# Patient Record
Sex: Female | Born: 1980 | Race: Black or African American | Hispanic: No | Marital: Married | Smoking: Never smoker
Health system: Southern US, Community
[De-identification: ages and names within clinical notes are randomized; demographics above are authoritative.]

## PROBLEM LIST (undated history)

## (undated) ENCOUNTER — Inpatient Hospital Stay (HOSPITAL_COMMUNITY): Payer: Self-pay

## (undated) DIAGNOSIS — Z789 Other specified health status: Secondary | ICD-10-CM

## (undated) DIAGNOSIS — IMO0002 Reserved for concepts with insufficient information to code with codable children: Secondary | ICD-10-CM

## (undated) DIAGNOSIS — N200 Calculus of kidney: Secondary | ICD-10-CM

## (undated) DIAGNOSIS — D649 Anemia, unspecified: Secondary | ICD-10-CM

## (undated) DIAGNOSIS — J302 Other seasonal allergic rhinitis: Secondary | ICD-10-CM

## (undated) HISTORY — DX: Reserved for concepts with insufficient information to code with codable children: IMO0002

## (undated) HISTORY — DX: Other seasonal allergic rhinitis: J30.2

## (undated) HISTORY — PX: RIGHT OOPHORECTOMY: SHX2359

---

## 2006-04-24 HISTORY — PX: OTHER SURGICAL HISTORY: SHX169

## 2009-04-13 ENCOUNTER — Other Ambulatory Visit: Admission: RE | Admit: 2009-04-13 | Discharge: 2009-04-13 | Payer: Self-pay | Admitting: Family Medicine

## 2010-01-31 ENCOUNTER — Ambulatory Visit: Payer: Self-pay | Admitting: Family Medicine

## 2010-01-31 DIAGNOSIS — S335XXA Sprain of ligaments of lumbar spine, initial encounter: Secondary | ICD-10-CM

## 2010-02-01 ENCOUNTER — Encounter (INDEPENDENT_AMBULATORY_CARE_PROVIDER_SITE_OTHER): Payer: Self-pay | Admitting: *Deleted

## 2010-02-01 DIAGNOSIS — IMO0002 Reserved for concepts with insufficient information to code with codable children: Secondary | ICD-10-CM | POA: Insufficient documentation

## 2010-02-03 ENCOUNTER — Ambulatory Visit (HOSPITAL_COMMUNITY)
Admission: RE | Admit: 2010-02-03 | Discharge: 2010-02-03 | Payer: Self-pay | Source: Home / Self Care | Admitting: Family Medicine

## 2010-02-04 ENCOUNTER — Encounter: Payer: Self-pay | Admitting: Family Medicine

## 2010-02-04 ENCOUNTER — Encounter: Payer: Self-pay | Admitting: *Deleted

## 2010-02-08 ENCOUNTER — Encounter: Payer: Self-pay | Admitting: Family Medicine

## 2010-04-20 ENCOUNTER — Ambulatory Visit (HOSPITAL_COMMUNITY)
Admission: RE | Admit: 2010-04-20 | Discharge: 2010-04-20 | Payer: Self-pay | Source: Home / Self Care | Attending: Obstetrics and Gynecology | Admitting: Obstetrics and Gynecology
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
## 2010-05-24 NOTE — Miscellaneous (Signed)
Summary: MRI ORDER  Clinical Lists Changes  Problems: Added new problem of THORACIC/LUMBOSACRAL NEURITIS/RADICULITIS UNSPEC (ICD-724.4) Orders: Added new Test order of MRI without Contrast (MRI w/o Contrast) - Signed      Impression & Recommendations:  Problem # 1:  THORACIC/LUMBOSACRAL NEURITIS/RADICULITIS UNSPEC (ICD-724.4)  Her updated medication list for this problem includes:    Flexeril 5 Mg Tabs (Cyclobenzaprine hcl) .Marland Kitchen... 1 by mouth two times a day / three times a day as needed back spasm  Orders: MRI without Contrast (MRI w/o Contrast) concern for acute disc rupture as she has had NO improvement in symptoms with conservative treatment, having difficulty ambulating or sitting.  Complete Medication List: 1)  Flexeril 5 Mg Tabs (Cyclobenzaprine hcl) .Marland Kitchen.. 1 by mouth two times a day / three times a day as needed back spasm   MRI SCHD FOR THUR OCT 13TH AT 5PM. PT TEXTED THE APPT TIME AND DATE. Lillia Pauls CMA  February 01, 2010 4:25 PM

## 2010-05-24 NOTE — Miscellaneous (Signed)
Summary: Toradol/TS  Clinical Lists Changes  Orders: Added new Service order of Ketorolac-Toradol 15mg  (Z6109) - Signed      Medication Administration  Injection # 1:    Medication: Ketorolac-Toradol 15mg     Diagnosis: LUMBAR SPRAIN AND STRAIN (ICD-847.2)    Route: IM    Site: LUOQ gluteus    Exp Date: 02/23/2011    Lot #: [id number]    Comments: 60 mg IM given per DR. Jennette Kettle    Patient tolerated injection without complications    Given by: Arlyss Repress CMA, (February 04, 2010 12:27 PM)  Orders Added: 1)  Ketorolac-Toradol 15mg  [[id number]]

## 2010-05-24 NOTE — Miscellaneous (Signed)
  Clinical Lists Changes  Medications: Added new medication of PREDNISONE 10 MG TABS (PREDNISONE) sig 6 by mouth once daily 5 days, 4 by mouth once daily 5 days and 2 by mouth once daily 2 days - Signed Rx of PREDNISONE 10 MG TABS (PREDNISONE) sig 6 by mouth once daily 5 days, 4 by mouth once daily 5 days and 2 by mouth once daily 2 days;  #54 x 0;  Signed;  Entered by: Denny Levy MD;  Authorized by: Denny Levy MD;  Method used: Electronically to [facility] [address] Shipping/mailing, [place], [place]  [address], Ph: [phone], Fax: [phone]    Prescriptions: PREDNISONE 10 MG TABS (PREDNISONE) sig 6 by mouth once daily 5 days, 4 by mouth once daily 5 days and 2 by mouth once daily 2 days  #54 x 0   Entered and Authorized by:   Denny Levy MD   Signed by:   Denny Levy MD on 02/04/2010   Method used:   Electronically to        [facility]* (retail)       [address]       [address] Shipping[address]mailing       [place], [place]  [address]       Ph: [phone]       Fax: [phone]   RxID:   [id number]

## 2010-05-24 NOTE — Miscellaneous (Signed)
  Clinical Lists Changes Spoke w TC--80% better. Given the quick improvement with steroids I suspect this is not a totally new herniation of the disc, probably an extrusion of an old herniation. I suspect at some point in a few years she may need surgical consult but for now we will cancel that consult.

## 2010-05-24 NOTE — Miscellaneous (Signed)
  Clinical Lists Changes  Orders: Added new Referral order of Neurosurgeon Referral (Neurosurgeon) - Signed      Complete Medication List: 1)  Flexeril 5 Mg Tabs (Cyclobenzaprine hcl) .Marland Kitchen.. 1 by mouth two times a day / three times a day as needed back spasm 2)  Prednisone 10 Mg Tabs (Prednisone) .... Sig 6 by mouth once daily 5 days, 4 by mouth once daily 5 days and 2 by mouth once daily 2 days

## 2010-05-24 NOTE — Miscellaneous (Signed)
  Clinical Lists Changes discussed MRI fidings. Pain still 7/10 despite NSAIDS and flexeril--is still trying to work (full clinic today) but pain is so intense she cannot sit or stand well.Advised her cancel rest of clinic, start steroid dose pak. I think she should go ahead and see NSU as I suspect this will need surgery. Will set up NSU appt eval at [facility]. She sill touch base with me Monday 10/16.

## 2010-07-02 ENCOUNTER — Emergency Department (HOSPITAL_COMMUNITY)
Admission: EM | Admit: 2010-07-02 | Discharge: 2010-07-02 | Disposition: A | Discharge status: Home / Self Care | Payer: 59 | Attending: Emergency Medicine | Admitting: Emergency Medicine

## 2010-07-02 DIAGNOSIS — L02419 Cutaneous abscess of limb, unspecified: Secondary | ICD-10-CM | POA: Insufficient documentation

## 2010-10-28 ENCOUNTER — Other Ambulatory Visit (HOSPITAL_COMMUNITY): Payer: Self-pay | Admitting: Obstetrics and Gynecology

## 2010-10-28 ENCOUNTER — Encounter (HOSPITAL_COMMUNITY): Payer: Self-pay

## 2010-10-28 ENCOUNTER — Ambulatory Visit (HOSPITAL_COMMUNITY)
Admission: RE | Admit: 2010-10-28 | Discharge: 2010-10-28 | Disposition: A | Discharge status: Home / Self Care | Payer: 59 | Source: Ambulatory Visit | Attending: Obstetrics and Gynecology | Admitting: Obstetrics and Gynecology

## 2010-10-28 DIAGNOSIS — R05 Cough: Secondary | ICD-10-CM | POA: Insufficient documentation

## 2010-10-28 DIAGNOSIS — O99891 Other specified diseases and conditions complicating pregnancy: Secondary | ICD-10-CM | POA: Insufficient documentation

## 2010-10-28 DIAGNOSIS — R059 Cough, unspecified: Secondary | ICD-10-CM | POA: Insufficient documentation

## 2010-12-07 ENCOUNTER — Ambulatory Visit (HOSPITAL_COMMUNITY)
Admission: RE | Admit: 2010-12-07 | Discharge: 2010-12-07 | Disposition: A | Discharge status: Home / Self Care | Payer: 59 | Source: Ambulatory Visit | Attending: Obstetrics and Gynecology | Admitting: Obstetrics and Gynecology

## 2010-12-07 ENCOUNTER — Other Ambulatory Visit (HOSPITAL_COMMUNITY): Payer: Self-pay | Admitting: Obstetrics and Gynecology

## 2010-12-07 DIAGNOSIS — R05 Cough: Secondary | ICD-10-CM

## 2010-12-07 DIAGNOSIS — R059 Cough, unspecified: Secondary | ICD-10-CM | POA: Insufficient documentation

## 2010-12-07 DIAGNOSIS — O99891 Other specified diseases and conditions complicating pregnancy: Secondary | ICD-10-CM | POA: Insufficient documentation

## 2010-12-07 DIAGNOSIS — R0989 Other specified symptoms and signs involving the circulatory and respiratory systems: Secondary | ICD-10-CM | POA: Insufficient documentation

## 2010-12-20 ENCOUNTER — Encounter: Payer: Self-pay | Admitting: Emergency Medicine

## 2010-12-20 ENCOUNTER — Ambulatory Visit (INDEPENDENT_AMBULATORY_CARE_PROVIDER_SITE_OTHER): Payer: 59 | Admitting: Emergency Medicine

## 2010-12-20 VITALS — BP 120/60 | HR 105 | Temp 98.8°F | Ht 63.0 in | Wt 191.0 lb

## 2010-12-20 DIAGNOSIS — R05 Cough: Secondary | ICD-10-CM

## 2010-12-20 DIAGNOSIS — R053 Chronic cough: Secondary | ICD-10-CM | POA: Insufficient documentation

## 2010-12-20 NOTE — Assessment & Plan Note (Signed)
Suspect components of GERD (exacerbated by pregnancy) and allergies. Consider pregnancy induced asthma.  - rx GERD and allergies at same time - f/u if still coughing, get spiro at that time/.

## 2010-12-20 NOTE — Patient Instructions (Signed)
Please start using fluticasone nasal spray, 2 sprays twice a day Restart zyrtec Continue prevacid 30mg  daily Call to follow up with Dr Delton Coombes if you are still coughing in 2 weeks. We will consider spirometry at that time if needed.

## 2010-12-20 NOTE — Progress Notes (Signed)
  Subjective:    Patient ID: Elaine Morris, female    DOB: 1980-05-02, 30 y.o.   MRN: [id number]  HPI 30 yo never smoker, hx allergies, G1P0 now 29 weeks. Evaluated for cough. She had some GERD since the pregnancy and has been on prevacid for a month. The GERD is better but not her cough. She was also started on flonase and it helped some but cough didn't disappear. Hasn't taken zyrtec x 1 month. She has been coughing x 4 months. Initially dry but then she began to have sputum in the am and at night, esp when supine.   Review of Systems  HENT: Positive for congestion and sneezing.   Respiratory: Positive for cough and shortness of breath.   Gastrointestinal: Positive for abdominal pain.    Past Medical History  Diagnosis Date  . Seasonal allergies   . Herniated disc L5     Family History  Problem Relation Age of Onset  . Asthma Mother   . Deep vein thrombosis Mother     dvt  . Melanoma      mgf     History   Social History  . Marital Status: Married    Spouse Name: nathan    Number of Children: 0  . Years of Education: N/A   Occupational History  . Physician    Social History Main Topics  . Smoking status: Never Smoker   . Smokeless tobacco: Not on file  . Alcohol Use: No  . Drug Use: No  . Sexually Active: Not on file   Other Topics Concern  . Not on file   Social History Narrative  . No narrative on file     No Known Allergies   No outpatient prescriptions prior to visit.         Objective:   Physical Exam  Gen: Pleasant, well-nourished, in no distress,  normal affect  ENT: No lesions,  mouth clear,  oropharynx clear, no postnasal drip  Neck: No JVD, no TMG, no carotid bruits  Lungs: No use of accessory muscles, no dullness to percussion, clear without rales or rhonchi  Cardiovascular: RRR, heart sounds normal, no murmur or gallops, no peripheral edema  Abdomen: gravid uterus  Musculoskeletal: No deformities, no cyanosis or  clubbing  Neuro: alert, non focal  Skin: Warm, no lesions or rashes     Assessment & Plan:  Chronic cough Suspect components of GERD (exacerbated by pregnancy) and allergies. Consider pregnancy induced asthma.  - rx GERD and allergies at same time - f/u if still coughing, get spiro at that time/.

## 2011-01-02 ENCOUNTER — Encounter (HOSPITAL_COMMUNITY): Payer: Self-pay | Admitting: *Deleted

## 2011-01-02 ENCOUNTER — Inpatient Hospital Stay (HOSPITAL_COMMUNITY)
Admission: AD | Admit: 2011-01-02 | Discharge: 2011-01-03 | Disposition: A | Discharge status: Home / Self Care | Payer: 59 | Source: Ambulatory Visit | Attending: Obstetrics and Gynecology | Admitting: Obstetrics and Gynecology

## 2011-01-02 DIAGNOSIS — O4700 False labor before 37 completed weeks of gestation, unspecified trimester: Secondary | ICD-10-CM | POA: Insufficient documentation

## 2011-01-02 DIAGNOSIS — B9689 Other specified bacterial agents as the cause of diseases classified elsewhere: Secondary | ICD-10-CM | POA: Insufficient documentation

## 2011-01-02 DIAGNOSIS — O2390 Unspecified genitourinary tract infection in pregnancy, unspecified trimester: Secondary | ICD-10-CM | POA: Insufficient documentation

## 2011-01-02 DIAGNOSIS — N760 Acute vaginitis: Secondary | ICD-10-CM | POA: Insufficient documentation

## 2011-01-02 DIAGNOSIS — R109 Unspecified abdominal pain: Secondary | ICD-10-CM

## 2011-01-02 DIAGNOSIS — A499 Bacterial infection, unspecified: Secondary | ICD-10-CM | POA: Insufficient documentation

## 2011-01-02 DIAGNOSIS — O479 False labor, unspecified: Secondary | ICD-10-CM

## 2011-01-02 NOTE — Progress Notes (Signed)
Severe rt flank pain off/on x 2 weeks, contractions. Denies dysuria.

## 2011-01-02 NOTE — Progress Notes (Signed)
Pt c/o of rt flank pain present x 2-3 weeks-has gotten worse this weekend

## 2011-01-02 NOTE — ED Provider Notes (Signed)
Elaine Morris is a 30 y.o. female presenting for worsening of R flank pain that started a few weeks ago and worsening of UC's. Flank pain similar to Kidney stones in past. No relief w/ Tylenol 1 gm and 1800. Maternal Medical History:  Reason for admission: Reason for admission: contractions.  Contractions: Onset was 1 week ago.   Frequency: irregular.   Duration is approximately 60 seconds.   Perceived severity is moderate.    Fetal activity: Perceived fetal activity is normal.   Last perceived fetal movement was within the past hour.    Prenatal Complications - Diabetes: none.    OB History    Grav Para Term Preterm Abortions TAB SAB Ect Mult Living   1              Past Medical History  Diagnosis Date  . Seasonal allergies   . Herniated disc L5  Kidney stones  Past Surgical History  Procedure Date  . Partial right oopharectomy 2008   Family History: family history includes Asthma in her mother; Deep vein thrombosis in her mother; and Melanoma in an unspecified family member. Social History:  reports that she has never smoked. She does not have any smokeless tobacco history on file. She reports that she does not drink alcohol or use illicit drugs.  Review of Systems  Constitutional: Negative.   Eyes: Negative for blurred vision.  Respiratory: Positive for cough (x 4 months). Negative for sputum production, shortness of breath and wheezing.   Gastrointestinal: Negative.   Genitourinary: Positive for flank pain. Negative for dysuria, urgency, frequency and hematuria.  Musculoskeletal: Negative for back pain (mild LBP C/W previous MS pain).    Dilation: Closed Effacement (%): Thick Exam by:: V.Mirca Yale,CNM Blood pressure 120/79, pulse 81, temperature 98 F (36.7 C), resp. rate 18, height 5\' 3"  (1.6 m), weight 86.637 kg (191 lb). Maternal Exam:  Uterine Assessment: Contraction strength is moderate.  Contraction duration is 60 seconds. Contraction frequency is irregular.    Q10 min w/ UI  Abdomen: Fundal height is S=D.    Introitus: Normal vulva. Vagina is positive for vaginal discharge (mod amount of thin white, mildly malodorous discharge).  Pelvis: adequate for delivery.   Cervix: Cervix evaluated by sterile speculum exam and digital exam.     Fetal Exam Fetal Monitor Review: Mode: ultrasound.   Baseline rate: 130.  Variability: moderate (6-25 bpm).   Pattern: accelerations present and no decelerations.    Fetal State Assessment: Category I - tracings are normal.     Physical Exam  Constitutional: She is oriented to person, place, and time. She appears well-developed and well-nourished. She appears distressed.  Cardiovascular: Normal rate.   Respiratory: Effort normal.  GI: Soft. There is no tenderness. There is no CVA tenderness.  Genitourinary: Uterus is not tender. Cervix exhibits no motion tenderness, no discharge and no friability. There is tenderness around the vagina. No erythema or bleeding around the vagina. Vaginal discharge (mod amount of thin white, mildly malodorous discharge) found.  Neurological: She is alert and oriented to person, place, and time.  Skin: Skin is warm and dry. She is not diaphoretic.  Psychiatric: She has a normal mood and affect.    Results for orders placed during the hospital encounter of 01/02/11 (from the past 24 hour(s))  URINALYSIS, ROUTINE W REFLEX MICROSCOPIC     Status: Abnormal   Collection Time   01/02/11 10:15 PM      Component Value Range   Color, Urine YELLOW  YELLOW    Appearance CLEAR  CLEAR    Specific Gravity, Urine <1.005 (*) 1.005 - 1.030    pH 6.5  5.0 - 8.0    Glucose, UA NEGATIVE  NEGATIVE (mg/dL)   Hgb urine dipstick TRACE (*) NEGATIVE    Bilirubin Urine NEGATIVE  NEGATIVE    Ketones, ur NEGATIVE  NEGATIVE (mg/dL)   Protein, ur NEGATIVE  NEGATIVE (mg/dL)   Urobilinogen, UA 0.2  0.0 - 1.0 (mg/dL)   Nitrite NEGATIVE  NEGATIVE    Leukocytes, UA TRACE (*) NEGATIVE   URINE  MICROSCOPIC-ADD ON     Status: Normal   Collection Time   01/02/11 10:15 PM      Component Value Range   Squamous Epithelial / LPF RARE  RARE    WBC, UA 0-2  <3 (WBC/hpf)   RBC / HPF 0-2  <3 (RBC/hpf)   Bacteria, UA RARE  RARE   WET PREP, GENITAL     Status: Abnormal   Collection Time   01/02/11 11:45 PM      Component Value Range   Yeast, Wet Prep NONE SEEN  NONE SEEN    Trich, Wet Prep NONE SEEN  NONE SEEN    Clue Cells, Wet Prep FEW (*) NONE SEEN    WBC, Wet Prep HPF POC FEW (*) NONE SEEN    US Renal  01/03/2011  *RADIOLOGY REPORT*  Clinical Data:  Right flank pain; history of renal stones.  RENAL/URINARY TRACT ULTRASOUND COMPLETE  Comparison:  MRI of the lumbar spine performed 02/03/2010  Findings:  Right Kidney:  The right kidney measures 11.2 cm in length.  The kidney demonstrates normal size, echogenicity and configuration. No significant cortical thinning is seen.  No hydronephrosis or calcification is identified.  No masses are seen.  Left Kidney:  The left kidney measures 10.4 cm in length.  The kidney demonstrates normal size, echogenicity and configuration. No significant cortical thinning is seen.  No hydronephrosis or calcification is identified.  No masses are seen.  Bladder:  The bladder is decompressed and not well assessed.  IMPRESSION: No evidence of hydronephrosis; unremarkable renal ultrasound.  Original Report Authenticated By: Tonia Ghent, M.D.   Prenatal labs: ABO, Rh:   Antibody:   Rubella:   RPR:    HBsAg:    HIV:    GBS:     Assessment/Plan: Assessment: 1. Preterm UC's  w/out cervical change, resolved 2. BV  3. R flank pain of unknown etiology, resolved w/ pain meds. No evidence of PIH  Plan: 1. D/C home per consult w/ Dr. Marcelle Overlie 2. F/U end of this week at [facility] 3. Increase rest and fluids 4. Rx Flagyl  Dorathy Kinsman 01/02/2011, 11:47 PM

## 2011-01-03 ENCOUNTER — Inpatient Hospital Stay (HOSPITAL_COMMUNITY): Payer: 59

## 2011-01-03 LAB — URINALYSIS, ROUTINE W REFLEX MICROSCOPIC
Nitrite: NEGATIVE
Protein, ur: NEGATIVE mg/dL
Specific Gravity, Urine: <1.005 — ABNORMAL LOW (ref 1.005–1.030)
Urobilinogen, UA: 0.2 mg/dL (ref 0.0–1.0)

## 2011-01-03 LAB — WET PREP, GENITAL: Yeast Wet Prep HPF POC: NONE SEEN

## 2011-01-03 LAB — URINE MICROSCOPIC-ADD ON

## 2011-01-03 MED ORDER — OXYCODONE-ACETAMINOPHEN 5-325 MG PO TABS
2.0000 | ORAL_TABLET | Freq: Once | ORAL | Status: AC
  Administered 2011-01-03: 2 via ORAL
  Filled 2011-01-03: qty 2

## 2011-01-03 MED ORDER — METRONIDAZOLE 500 MG PO TABS: 500.0000 mg | ORAL_TABLET | Freq: Two times a day (BID) | ORAL | Status: AC

## 2011-01-03 MED ORDER — TERBUTALINE SULFATE 1 MG/ML IJ SOLN
INTRAMUSCULAR | Status: AC
  Filled 2011-01-03: qty 1

## 2011-01-03 MED ORDER — TERBUTALINE SULFATE 1 MG/ML IJ SOLN
0.2500 mg | Freq: Once | INTRAMUSCULAR | Status: AC
  Administered 2011-01-03: 1 mg via SUBCUTANEOUS

## 2011-01-03 MED ORDER — OXYCODONE-ACETAMINOPHEN 5-325 MG PO TABS: 1.0000 | ORAL_TABLET | ORAL | Status: AC | PRN

## 2011-03-01 ENCOUNTER — Inpatient Hospital Stay (HOSPITAL_COMMUNITY): Admission: RE | Admit: 2011-03-01 | Payer: 59 | Source: Ambulatory Visit | Admitting: Obstetrics and Gynecology

## 2011-03-01 ENCOUNTER — Encounter (HOSPITAL_COMMUNITY): Payer: Self-pay | Admitting: Anesthesiology

## 2011-03-01 ENCOUNTER — Inpatient Hospital Stay (HOSPITAL_COMMUNITY): Payer: 59

## 2011-03-01 ENCOUNTER — Inpatient Hospital Stay (HOSPITAL_COMMUNITY): Payer: 59 | Admitting: Anesthesiology

## 2011-03-01 ENCOUNTER — Inpatient Hospital Stay (HOSPITAL_COMMUNITY)
Admission: AD | Admit: 2011-03-01 | Discharge: 2011-03-03 | DRG: 766 | Disposition: A | Discharge status: Home / Self Care | Payer: 59 | Source: Ambulatory Visit | Attending: Obstetrics and Gynecology | Admitting: Obstetrics and Gynecology

## 2011-03-01 ENCOUNTER — Encounter (HOSPITAL_COMMUNITY): Payer: Self-pay | Admitting: *Deleted

## 2011-03-01 ENCOUNTER — Encounter (HOSPITAL_COMMUNITY)
Admission: AD | Disposition: A | Discharge status: Home / Self Care | Payer: Self-pay | Source: Ambulatory Visit | Attending: Obstetrics and Gynecology

## 2011-03-01 DIAGNOSIS — R05 Cough: Secondary | ICD-10-CM

## 2011-03-01 DIAGNOSIS — S335XXA Sprain of ligaments of lumbar spine, initial encounter: Secondary | ICD-10-CM

## 2011-03-01 DIAGNOSIS — IMO0002 Reserved for concepts with insufficient information to code with codable children: Secondary | ICD-10-CM

## 2011-03-01 DIAGNOSIS — O321XX0 Maternal care for breech presentation, not applicable or unspecified: Principal | ICD-10-CM | POA: Diagnosis present

## 2011-03-01 HISTORY — DX: Other specified health status: Z78.9

## 2011-03-01 HISTORY — DX: Calculus of kidney: N20.0

## 2011-03-01 LAB — CBC
HCT: 31.3 % — ABNORMAL LOW (ref 36.0–46.0)
Hemoglobin: 9.8 g/dL — ABNORMAL LOW (ref 12.0–15.0)
MCV: 80.7 fL (ref 78.0–100.0)
RBC: 3.88 MIL/uL (ref 3.87–5.11)
WBC: 8.1 10*3/uL (ref 4.0–10.5)

## 2011-03-01 SURGERY — Surgical Case
Anesthesia: Regional | Site: Uterus | Wound class: Clean Contaminated

## 2011-03-01 MED ORDER — FAMOTIDINE IN NACL 20-0.9 MG/50ML-% IV SOLN
INTRAVENOUS | Status: AC
  Administered 2011-03-01: 20 mg via INTRAVENOUS
  Filled 2011-03-01: qty 50

## 2011-03-01 MED ORDER — NALBUPHINE SYRINGE 5 MG/0.5 ML
5.0000 mg | INJECTION | INTRAMUSCULAR | Status: DC | PRN
  Filled 2011-03-01: qty 1

## 2011-03-01 MED ORDER — FENTANYL CITRATE 0.05 MG/ML IJ SOLN
25.0000 ug | INTRAMUSCULAR | Status: DC | PRN
  Administered 2011-03-01: 50 ug via INTRAVENOUS

## 2011-03-01 MED ORDER — MENTHOL 3 MG MT LOZG: 1.0000 | LOZENGE | OROMUCOSAL | Status: DC | PRN

## 2011-03-01 MED ORDER — LACTATED RINGERS IV SOLN
INTRAVENOUS | Status: DC
  Administered 2011-03-01 (×5): via INTRAVENOUS

## 2011-03-01 MED ORDER — KETOROLAC TROMETHAMINE 30 MG/ML IJ SOLN
30.0000 mg | Freq: Four times a day (QID) | INTRAMUSCULAR | Status: AC | PRN
  Administered 2011-03-01: 30 mg via INTRAVENOUS

## 2011-03-01 MED ORDER — EPHEDRINE SULFATE 50 MG/ML IJ SOLN
INTRAMUSCULAR | Status: DC | PRN
  Administered 2011-03-01 (×2): 5 mg via INTRAVENOUS
  Administered 2011-03-01 (×2): 10 mg via INTRAVENOUS

## 2011-03-01 MED ORDER — CITRIC ACID-SODIUM CITRATE 334-500 MG/5ML PO SOLN
30.0000 mL | Freq: Once | ORAL | Status: AC
  Administered 2011-03-01: 30 mL via ORAL

## 2011-03-01 MED ORDER — FAMOTIDINE IN NACL 20-0.9 MG/50ML-% IV SOLN
20.0000 mg | Freq: Once | INTRAVENOUS | Status: AC
  Administered 2011-03-01: 20 mg via INTRAVENOUS

## 2011-03-01 MED ORDER — BUPIVACAINE IN DEXTROSE 0.75-8.25 % IT SOLN
INTRATHECAL | Status: DC | PRN
  Administered 2011-03-01: 1.6 mL via INTRATHECAL

## 2011-03-01 MED ORDER — FENTANYL CITRATE 0.05 MG/ML IJ SOLN
INTRAMUSCULAR | Status: DC | PRN
  Administered 2011-03-01: 25 ug via INTRATHECAL

## 2011-03-01 MED ORDER — METOCLOPRAMIDE HCL 5 MG/ML IJ SOLN
10.0000 mg | Freq: Once | INTRAMUSCULAR | Status: AC | PRN
  Administered 2011-03-01: 10 mg via INTRAVENOUS

## 2011-03-01 MED ORDER — CEFAZOLIN SODIUM 1-5 GM-% IV SOLN: 1.0000 g | INTRAVENOUS | Status: DC

## 2011-03-01 MED ORDER — TETANUS-DIPHTH-ACELL PERTUSSIS 5-2.5-18.5 LF-MCG/0.5 IM SUSP: 0.5000 mL | Freq: Once | INTRAMUSCULAR | Status: DC

## 2011-03-01 MED ORDER — KETOROLAC TROMETHAMINE 30 MG/ML IJ SOLN: 30.0000 mg | Freq: Four times a day (QID) | INTRAMUSCULAR | Status: AC | PRN

## 2011-03-01 MED ORDER — LANOLIN HYDROUS EX OINT: 1.0000 "application " | TOPICAL_OINTMENT | CUTANEOUS | Status: DC | PRN

## 2011-03-01 MED ORDER — DIPHENHYDRAMINE HCL 25 MG PO CAPS
25.0000 mg | ORAL_CAPSULE | ORAL | Status: DC | PRN
  Administered 2011-03-01: 25 mg via ORAL
  Filled 2011-03-01 (×2): qty 1

## 2011-03-01 MED ORDER — PANTOPRAZOLE SODIUM 40 MG PO TBEC
40.0000 mg | DELAYED_RELEASE_TABLET | Freq: Every day | ORAL | Status: DC
  Filled 2011-03-01 (×4): qty 1

## 2011-03-01 MED ORDER — FLEET ENEMA 7-19 GM/118ML RE ENEM: 1.0000 | ENEMA | RECTAL | Status: DC | PRN

## 2011-03-01 MED ORDER — MEPERIDINE HCL 25 MG/ML IJ SOLN
INTRAMUSCULAR | Status: AC
  Filled 2011-03-01: qty 1

## 2011-03-01 MED ORDER — PRENATAL PLUS 27-1 MG PO TABS
1.0000 | ORAL_TABLET | Freq: Every day | ORAL | Status: DC
  Administered 2011-03-02 – 2011-03-03 (×2): 1 via ORAL
  Filled 2011-03-01 (×2): qty 1

## 2011-03-01 MED ORDER — OXYCODONE-ACETAMINOPHEN 5-325 MG PO TABS
1.0000 | ORAL_TABLET | Freq: Four times a day (QID) | ORAL | Status: DC | PRN
  Administered 2011-03-01 – 2011-03-02 (×3): 1 via ORAL
  Administered 2011-03-02 (×2): 2 via ORAL
  Administered 2011-03-02 (×3): 1 via ORAL
  Filled 2011-03-01: qty 2
  Filled 2011-03-01: qty 1
  Filled 2011-03-01: qty 2
  Filled 2011-03-01 (×5): qty 1

## 2011-03-01 MED ORDER — KETOROLAC TROMETHAMINE 30 MG/ML IJ SOLN
INTRAMUSCULAR | Status: AC
  Administered 2011-03-01: 30 mg via INTRAVENOUS
  Filled 2011-03-01: qty 1

## 2011-03-01 MED ORDER — MEASLES, MUMPS & RUBELLA VAC ~~LOC~~ INJ
0.5000 mL | INJECTION | Freq: Once | SUBCUTANEOUS | Status: DC
  Filled 2011-03-01: qty 0.5

## 2011-03-01 MED ORDER — FENTANYL CITRATE 0.05 MG/ML IJ SOLN
INTRAMUSCULAR | Status: AC
  Administered 2011-03-01: 50 ug via INTRAVENOUS
  Filled 2011-03-01: qty 2

## 2011-03-01 MED ORDER — SODIUM CHLORIDE 0.9 % IV SOLN: 250.0000 mL | INTRAVENOUS | Status: DC

## 2011-03-01 MED ORDER — DIPHENHYDRAMINE HCL 50 MG/ML IJ SOLN
INTRAMUSCULAR | Status: AC
  Administered 2011-03-01: 25 mg via INTRAMUSCULAR
  Filled 2011-03-01: qty 1

## 2011-03-01 MED ORDER — MORPHINE SULFATE (PF) 0.5 MG/ML IJ SOLN
INTRAMUSCULAR | Status: DC | PRN
  Administered 2011-03-01: .15 mg via INTRATHECAL

## 2011-03-01 MED ORDER — ONDANSETRON HCL 4 MG/2ML IJ SOLN
INTRAMUSCULAR | Status: DC | PRN
  Administered 2011-03-01: 4 mg via INTRAVENOUS

## 2011-03-01 MED ORDER — ONDANSETRON HCL 4 MG/2ML IJ SOLN: 4.0000 mg | Freq: Three times a day (TID) | INTRAMUSCULAR | Status: DC | PRN

## 2011-03-01 MED ORDER — METOCLOPRAMIDE HCL 5 MG/ML IJ SOLN
INTRAMUSCULAR | Status: AC
  Administered 2011-03-01: 10 mg via INTRAVENOUS
  Filled 2011-03-01: qty 2

## 2011-03-01 MED ORDER — SIMETHICONE 80 MG PO CHEW
80.0000 mg | CHEWABLE_TABLET | Freq: Three times a day (TID) | ORAL | Status: DC
  Administered 2011-03-01 – 2011-03-03 (×5): 80 mg via ORAL

## 2011-03-01 MED ORDER — DIPHENHYDRAMINE HCL 25 MG PO CAPS: 25.0000 mg | ORAL_CAPSULE | Freq: Four times a day (QID) | ORAL | Status: DC | PRN

## 2011-03-01 MED ORDER — ZOLPIDEM TARTRATE 5 MG PO TABS: 5.0000 mg | ORAL_TABLET | Freq: Every evening | ORAL | Status: DC | PRN

## 2011-03-01 MED ORDER — CITRIC ACID-SODIUM CITRATE 334-500 MG/5ML PO SOLN
ORAL | Status: AC
  Filled 2011-03-01: qty 15

## 2011-03-01 MED ORDER — WITCH HAZEL-GLYCERIN EX PADS: 1.0000 "application " | MEDICATED_PAD | CUTANEOUS | Status: DC | PRN

## 2011-03-01 MED ORDER — NALBUPHINE SYRINGE 5 MG/0.5 ML
5.0000 mg | INJECTION | INTRAMUSCULAR | Status: DC | PRN
  Administered 2011-03-01: 10 mg via SUBCUTANEOUS
  Filled 2011-03-01: qty 1

## 2011-03-01 MED ORDER — SODIUM CHLORIDE 0.9 % IJ SOLN: 3.0000 mL | Freq: Two times a day (BID) | INTRAMUSCULAR | Status: DC

## 2011-03-01 MED ORDER — DIPHENHYDRAMINE HCL 50 MG/ML IJ SOLN: 12.5000 mg | INTRAMUSCULAR | Status: DC | PRN

## 2011-03-01 MED ORDER — LORATADINE 10 MG PO TABS
10.0000 mg | ORAL_TABLET | Freq: Every day | ORAL | Status: DC
  Administered 2011-03-02 – 2011-03-03 (×2): 10 mg via ORAL
  Filled 2011-03-01 (×4): qty 1

## 2011-03-01 MED ORDER — SODIUM CHLORIDE 0.9 % IJ SOLN: 3.0000 mL | INTRAMUSCULAR | Status: DC | PRN

## 2011-03-01 MED ORDER — BISACODYL 10 MG RE SUPP: 10.0000 mg | Freq: Every day | RECTAL | Status: DC | PRN

## 2011-03-01 MED ORDER — NALOXONE HCL 0.4 MG/ML IJ SOLN: 0.4000 mg | INTRAMUSCULAR | Status: DC | PRN

## 2011-03-01 MED ORDER — SCOPOLAMINE 1 MG/3DAYS TD PT72
MEDICATED_PATCH | TRANSDERMAL | Status: AC
  Administered 2011-03-01: 1.5 mg via TOPICAL
  Filled 2011-03-01: qty 1

## 2011-03-01 MED ORDER — DIBUCAINE 1 % RE OINT: 1.0000 "application " | TOPICAL_OINTMENT | RECTAL | Status: DC | PRN

## 2011-03-01 MED ORDER — CEFAZOLIN SODIUM 1-5 GM-% IV SOLN
INTRAVENOUS | Status: DC | PRN
  Administered 2011-03-01: 1 g via INTRAVENOUS

## 2011-03-01 MED ORDER — IBUPROFEN 600 MG PO TABS
600.0000 mg | ORAL_TABLET | Freq: Four times a day (QID) | ORAL | Status: DC | PRN
  Administered 2011-03-01 – 2011-03-03 (×5): 600 mg via ORAL
  Filled 2011-03-01 (×7): qty 1

## 2011-03-01 MED ORDER — SENNOSIDES-DOCUSATE SODIUM 8.6-50 MG PO TABS
2.0000 | ORAL_TABLET | Freq: Every day | ORAL | Status: DC
  Administered 2011-03-01 – 2011-03-02 (×2): 2 via ORAL

## 2011-03-01 MED ORDER — MEPERIDINE HCL 25 MG/ML IJ SOLN
6.2500 mg | INTRAMUSCULAR | Status: DC | PRN
  Administered 2011-03-01: 6.25 mg via INTRAVENOUS

## 2011-03-01 MED ORDER — SODIUM CHLORIDE 0.9 % IV SOLN
1.0000 ug/kg/h | INTRAVENOUS | Status: DC | PRN
  Filled 2011-03-01: qty 2.5

## 2011-03-01 MED ORDER — DIPHENHYDRAMINE HCL 50 MG/ML IJ SOLN
25.0000 mg | INTRAMUSCULAR | Status: DC | PRN
  Administered 2011-03-01: 25 mg via INTRAMUSCULAR

## 2011-03-01 MED ORDER — OXYTOCIN 20 UNITS IN LACTATED RINGERS INFUSION - SIMPLE: 125.0000 mL/h | INTRAVENOUS | Status: AC

## 2011-03-01 MED ORDER — OXYTOCIN 10 UNIT/ML IJ SOLN
INTRAMUSCULAR | Status: DC | PRN
  Administered 2011-03-01 (×2): 20 [IU] via INTRAMUSCULAR

## 2011-03-01 MED ORDER — SCOPOLAMINE 1 MG/3DAYS TD PT72
1.0000 | MEDICATED_PATCH | Freq: Once | TRANSDERMAL | Status: DC
  Filled 2011-03-01: qty 1

## 2011-03-01 MED ORDER — IBUPROFEN 800 MG PO TABS: 800.0000 mg | ORAL_TABLET | Freq: Three times a day (TID) | ORAL | Status: DC | PRN

## 2011-03-01 MED ORDER — SIMETHICONE 80 MG PO CHEW: 80.0000 mg | CHEWABLE_TABLET | ORAL | Status: DC | PRN

## 2011-03-01 MED ORDER — FLUTICASONE PROPIONATE 50 MCG/ACT NA SUSP
1.0000 | Freq: Every day | NASAL | Status: DC | PRN
  Filled 2011-03-01: qty 16

## 2011-03-01 SURGICAL SUPPLY — 22 items
CLOTH BEACON ORANGE TIMEOUT ST (SAFETY) ×2 IMPLANT
DRESSING TELFA 8X3 (GAUZE/BANDAGES/DRESSINGS) ×2 IMPLANT
ELECT REM PT RETURN 9FT ADLT (ELECTROSURGICAL) ×2
ELECTRODE REM PT RTRN 9FT ADLT (ELECTROSURGICAL) ×1 IMPLANT
EXTRACTOR VACUUM M CUP 4 TUBE (SUCTIONS) IMPLANT
GAUZE SPONGE 4X4 12PLY STRL LF (GAUZE/BANDAGES/DRESSINGS) ×4 IMPLANT
GLOVE BIO SURGEON STRL SZ7 (GLOVE) ×4 IMPLANT
GOWN PREVENTION PLUS LG XLONG (DISPOSABLE) ×6 IMPLANT
KIT ABG SYR 3ML LUER SLIP (SYRINGE) ×2 IMPLANT
NEEDLE HYPO 25X5/8 SAFETYGLIDE (NEEDLE) ×2 IMPLANT
NS IRRIG 1000ML POUR BTL (IV SOLUTION) ×2 IMPLANT
PACK C SECTION WH (CUSTOM PROCEDURE TRAY) ×2 IMPLANT
PAD ABD 7.5X8 STRL (GAUZE/BANDAGES/DRESSINGS) ×2 IMPLANT
SLEEVE SCD COMPRESS KNEE MED (MISCELLANEOUS) ×2 IMPLANT
SUT CHROMIC 0 CTX 36 (SUTURE) ×6 IMPLANT
SUT MON AB 4-0 PS1 27 (SUTURE) ×2 IMPLANT
SUT PDS AB 0 CT1 27 (SUTURE) ×4 IMPLANT
SUT VIC AB 3-0 CT1 27 (SUTURE) ×1
SUT VIC AB 3-0 CT1 TAPERPNT 27 (SUTURE) ×1 IMPLANT
TOWEL OR 17X24 6PK STRL BLUE (TOWEL DISPOSABLE) ×4 IMPLANT
TRAY FOLEY CATH 14FR (SET/KITS/TRAYS/PACK) ×2 IMPLANT
WATER STERILE IRR 1000ML POUR (IV SOLUTION) ×2 IMPLANT

## 2011-03-01 NOTE — Progress Notes (Signed)
Pr reports contractions q 9-10 minutes. G2P0, no problems . Scheduled C/S 11/16 for breech

## 2011-03-01 NOTE — Preoperative (Signed)
Beta Blockers   Reason not to administer Beta Blockers:Not Applicable 

## 2011-03-01 NOTE — Progress Notes (Signed)
Patient states contractions every 1 1hr and 40 mins. No vaginal bleeding or fluid

## 2011-03-01 NOTE — Consult Note (Signed)
Called to elective primary C/S for breech presentation with additional h/o of non reassuring FHR.  No other risk factors reported. Infant in frank breech presentation, meconium stool observed during extraction. Spontaneous cries and good tone observed.  Given tactile stimulation and bulb suction after placement under radiant warmer.  No dysmorphic features.   Shown to parents then carried to Transitional Nursery. Care to assigned pediatrician.   Dagoberto Ligas MD [facility]

## 2011-03-01 NOTE — Anesthesia Procedure Notes (Signed)
Spinal  Patient location during procedure: OR Start time: 03/01/2011 9:53 AM Staffing Anesthesiologist: Seith Aikey A. Performed by: anesthesiologist  Preanesthetic Checklist Completed: patient identified, site marked, surgical consent, pre-op evaluation, timeout performed, IV checked, risks and benefits discussed and monitors and equipment checked Spinal Block Patient position: sitting Prep: DuraPrep Patient monitoring: heart rate, cardiac monitor, continuous pulse ox and blood pressure Approach: midline Location: L3-4 Injection technique: single-shot Needle Needle type: Sprotte  Needle gauge: 24 G Needle length: 9 cm Needle insertion depth: 5 cm Assessment Sensory level: T4 Additional Notes Patient tolerated procedure well. Adequate sensory level.

## 2011-03-01 NOTE — Treatment Plan (Signed)
Report called to Dr. Marcelle Overlie ref. nonreactive but reassuring strip after fluid bolus.

## 2011-03-01 NOTE — Op Note (Signed)
Preoperative diagnosis: Breech presentation at term, early labor, nonreactive nonstress test  Postoperative diagnosis: Same  Procedure: Primary low transverse cesarean section  Surgeon: Marcelle Overlie  Anesthesia: Spinal  Complications: None  EBL: 800 cc  Procedure and findings:  The patient was taken to the operating room after an adequate level of spinal anesthetic was obtained with the patient in left tilt position the abdomen prepped and draped in the usual manner for cesarean section. Foley catheter positioned draining clear urine. Transverse incision was made excising the old scar and an ellipse this is carried down to the fascia which was incised and extended transversely. Rectus muscles divided in the midline peritoneum entered superiorly without incident and extended in a vertical fashion. The vesicouterine serosa was incised and the bladder was bluntly and sharply dissected below, bladder blade repositioned transverse incision made in the lower segment extended with bandage scissors clear fluid noted the patient then delivered of a healthy infant from the frank breech presentation, easy full breech extraction without difficulty. The infant was suctioned cord clamped and passed the pediatric team for further care for pH and cord blood was obtained. Placenta was then delivered manually intact, uterus exteriorized cavity wiped clean with laparotomy pack closure obtained with the first layer of 0 chromic in a locked fashion followed by an imbricating layer of 0 chromic. This is hemostatic the bladder flap area was inspected and noted be intact and hemostatic clear urine noted at that point. Bilateral tubes and ovaries were normal. There were several small less than 2 cm serosal fibroids noted. Prior to closure sponge needle instrument counts reported as correct x2 peritoneum was then closed with a running 2-0 Vicryl suture. 2-0 Vicryl interrupted sutures used to close the rectus muscles in the midline  a 0 PDS suture was then used from laterally to midline a closed fashion subcutaneous tissue was hemostatic a 4-0 Monocryl subcuticular closure was obtained along with pressure dressing. She did receive Ancef 1 g IV preoperatively and Pitocin IV after the cord was clamped clear urine noted in the case. Mother and baby doing well at that point. Dictated with [employer], not proofread. Divinity Kyler M. Milana Obey.D.

## 2011-03-01 NOTE — Anesthesia Preprocedure Evaluation (Addendum)
Anesthesia Evaluation  Patient identified by MRN, date of birth, ID band Patient awake    Reviewed: Allergy & Precautions, H&P , Patient's Chart, lab work & pertinent test results  Airway Mallampati: II TM Distance: >3 FB Neck ROM: Full    Dental No notable dental hx. (+) Teeth Intact   Pulmonary neg pulmonary ROS,  clear to auscultation  Pulmonary exam normal       Cardiovascular neg cardio ROS Regular Normal    Neuro/Psych Negative Neurological ROS  Negative Psych ROS   GI/Hepatic negative GI ROS, Neg liver ROS,   Endo/Other  Negative Endocrine ROS  Renal/GU negative Renal ROS  Genitourinary negative   Musculoskeletal   Abdominal   Peds  Hematology negative hematology ROS (+)   Anesthesia Other Findings   Reproductive/Obstetrics (+) Pregnancy                          Anesthesia Physical Anesthesia Plan  ASA: II  Anesthesia Plan: Spinal   Post-op Pain Management:    Induction:   Airway Management Planned:   Additional Equipment:   Intra-op Plan:   Post-operative Plan:   Informed Consent: I have reviewed the patients History and Physical, chart, labs and discussed the procedure including the risks, benefits and alternatives for the proposed anesthesia with the patient or authorized representative who has indicated his/her understanding and acceptance.     Plan Discussed with: Anesthesiologist  Anesthesia Plan Comments:         Anesthesia Quick Evaluation

## 2011-03-01 NOTE — H&P (Signed)
Elaine Morris is a 30 y.o. female presenting for labor at term, sched primary CS for Breech.  In MAU, had NR NST with cont ctx q 5-7 min.  [place] confirms >>>breech Maternal Medical History:  Reason for admission: Reason for admission: contractions.  Contractions: Onset was 3-5 hours ago.   Frequency: regular.   Perceived severity is moderate.    Fetal activity: Perceived fetal activity is normal.   Last perceived fetal movement was within the past hour.      OB History    Grav Para Term Preterm Abortions TAB SAB Ect Mult Living   2 0   1  1   0     Past Medical History  Diagnosis Date  . Seasonal allergies   . Herniated disc L5  . No pertinent past medical history   . Kidney stone    Past Surgical History  Procedure Date  . Partial right oopharectomy 2008  . Right oophorectomy     partial    Family History: family history includes Asthma in her mother; Deep vein thrombosis in her mother; and Melanoma in an unspecified family member. Social History:  reports that she has never smoked. She does not have any smokeless tobacco history on file. She reports that she does not drink alcohol or use illicit drugs.  ROS  Dilation: 1 Station: Ballotable Exam by:: M.Topp,RN Blood pressure 130/75, pulse 72, temperature 98.3 F (36.8 C), temperature source Oral, resp. rate 20, height 5\' 3"  (1.6 m), weight 90.266 kg (199 lb), SpO2 96.00%. Maternal Exam:  Uterine Assessment: Contraction strength is moderate.  Contraction frequency is regular.   Abdomen: Patient reports no abdominal tenderness. Surgical scars: low transverse.   Fundal height is term.   Estimated fetal weight is AGA.   Fetal presentation: breech     Physical Exam  Constitutional: She is oriented to person, place, and time. She appears well-developed and well-nourished.  HENT:  Head: Normocephalic.  Neck: Normal range of motion. Neck supple.  Cardiovascular: Normal rate and regular rhythm.   Respiratory: Effort  normal and breath sounds normal.  Genitourinary:       cx 1 cm/post/BOW I  Neurological: She is alert and oriented to person, place, and time.    Prenatal labs: ABO, Rh:   Antibody:   Rubella:   RPR:    HBsAg:    HIV:    GBS:     Assessment/Plan: Breech presentation at term, early labor with NR- NST>>discussed CS w/ pt + husband, risks regarding bleeding, infxn,adjacent organ injury, transfusion, all reviewed.   Evans Levee M 03/01/2011, 9:24 AM

## 2011-03-01 NOTE — Anesthesia Postprocedure Evaluation (Signed)
  Anesthesia Post-op Note  Patient: Elaine Morris  Procedure(s) Performed:  CESAREAN SECTION  Patient Location: PACU  Anesthesia Type: Spinal  Level of Consciousness: awake, alert  and oriented  Airway and Oxygen Therapy: Patient Spontanous Breathing  Post-op Pain: none  Post-op Assessment: Post-op Vital signs reviewed, Patient's Cardiovascular Status Stable, Respiratory Function Stable, Patent Airway, No signs of Nausea or vomiting, Pain level controlled, No headache and No backache. Sensory and Motor Block resolving.  Post-op Vital Signs: Reviewed and stable  Complications: No apparent anesthesia complications

## 2011-03-01 NOTE — Progress Notes (Signed)
MD called for update.  Report given on difficulty to start IV, VE, and U/C pattern.  Non reactive strip but reassuring.

## 2011-03-01 NOTE — Transfer of Care (Signed)
Immediate Anesthesia Transfer of Care Note  Patient: [facility]  Procedure(s) Performed:  CESAREAN SECTION  Patient Location: PACU  Anesthesia Type: Spinal  Level of Consciousness: awake and alert   Airway & Oxygen Therapy: Patient Spontanous Breathing  Post-op Assessment: Report given to PACU RN  Post vital signs: Reviewed and stable  Complications: No apparent anesthesia complications

## 2011-03-02 ENCOUNTER — Encounter (HOSPITAL_COMMUNITY): Payer: Self-pay | Admitting: Obstetrics and Gynecology

## 2011-03-02 LAB — CBC
MCH: 25.7 pg — ABNORMAL LOW (ref 26.0–34.0)
MCHC: 31.8 g/dL (ref 30.0–36.0)
MCV: 81 fL (ref 78.0–100.0)
Platelets: 176 10*3/uL (ref 150–400)
RBC: 3.15 MIL/uL — ABNORMAL LOW (ref 3.87–5.11)
RDW: 14.9 % (ref 11.5–15.5)

## 2011-03-02 MED ORDER — OXYCODONE-ACETAMINOPHEN 5-325 MG PO TABS
1.0000 | ORAL_TABLET | ORAL | Status: DC | PRN
  Administered 2011-03-03 (×2): 2 via ORAL
  Filled 2011-03-02 (×2): qty 2

## 2011-03-02 NOTE — Progress Notes (Signed)
Subjective: Postpartum Day 1: Cesarean Delivery Patient reports tolerating PO, + flatus and no problems voiding.    Objective: Vital signs in last 24 hours: Temp:  [97.5 F (36.4 C)-98.5 F (36.9 C)] 98 F (36.7 C) (11/08 0400) Pulse Rate:  [58-103] 73  (11/08 0400) Resp:  [16-24] 18  (11/08 0400) BP: (102-134)/(48-85) 102/59 mmHg (11/08 0400) SpO2:  [92 %-100 %] 97 % (11/08 0400)  Physical Exam:  General: alert and cooperative Lochia: appropriate Uterine Fundus: firm abd dressing CDI DVT Evaluation: No evidence of DVT seen on physical exam.   Basename 03/02/11 0535 03/01/11 0829  HGB 8.1* 9.8*  HCT 25.5* 31.3*    Assessment/Plan: Status post Cesarean section. Doing well postoperatively.  Continue current care.  Manha Amato G 03/02/2011, 8:09 AM

## 2011-03-03 MED ORDER — OXYCODONE-ACETAMINOPHEN 5-325 MG PO TABS: 1.0000 | ORAL_TABLET | ORAL | Status: AC | PRN

## 2011-03-03 MED ORDER — IBUPROFEN 600 MG PO TABS: 600.0000 mg | ORAL_TABLET | Freq: Four times a day (QID) | ORAL | Status: AC | PRN

## 2011-03-03 NOTE — Discharge Summary (Signed)
Obstetric Discharge Summary Reason for Admission: cesarean section and breech presentation Prenatal Procedures: ultrasound Intrapartum Procedures: cesarean: low cervical, transverse Postpartum Procedures: none Complications-Operative and Postpartum: none Hemoglobin  Date Value Range Status  03/02/2011 8.1* 12.0-15.0 (g/dL) Final     DELTA CHECK NOTED     REPEATED TO VERIFY     HCT  Date Value Range Status  03/02/2011 25.5* 36.0-46.0 (%) Final    Discharge Diagnoses: Term Pregnancy-delivered  Discharge Information: Date: 03/03/2011 Activity: pelvic rest Diet: routine Medications: PNV, Ibuprofen and Percocet Condition: stable Instructions: refer to practice specific booklet Discharge to: home   Newborn Data: Live born female  Birth Weight: 7 lb 12.3 oz (3525 g) APGAR: 9, 9  Home with mother.  CURTIS,CAROL G 03/03/2011, 7:57 AM

## 2011-03-03 NOTE — Progress Notes (Signed)
Subjective: Postpartum Day 2: Cesarean Delivery Patient reports tolerating PO, + flatus and no problems voiding.  Desires early discharge  Objective: Vital signs in last 24 hours: Temp:  [97.2 F (36.2 C)-98.5 F (36.9 C)] 97.2 F (36.2 C) (11/09 0611) Pulse Rate:  [79-89] 79  (11/09 0611) Resp:  [18-20] 18  (11/09 0611) BP: (100-103)/(60-67) 100/60 mmHg (11/09 1610)  Physical Exam:  General: alert and cooperative Lochia: appropriate Uterine Fundus: firm Incision: healing well DVT Evaluation: No evidence of DVT seen on physical exam.   Basename 03/02/11 0535 03/01/11 0829  HGB 8.1* 9.8*  HCT 25.5* 31.3*    Assessment/Plan: Status post Cesarean section. Doing well postoperatively.  Discharge home with standard precautions and return to clinic in 1-2 weeks for incision check.  CURTIS,CAROL G 03/03/2011, 7:50 AM

## 2011-03-07 ENCOUNTER — Other Ambulatory Visit (HOSPITAL_COMMUNITY): Payer: 59

## 2011-06-07 ENCOUNTER — Ambulatory Visit (INDEPENDENT_AMBULATORY_CARE_PROVIDER_SITE_OTHER): Payer: 59 | Admitting: Family Medicine

## 2011-06-07 VITALS — BP 126/80

## 2011-06-07 DIAGNOSIS — M654 Radial styloid tenosynovitis [de Quervain]: Secondary | ICD-10-CM | POA: Insufficient documentation

## 2011-06-07 MED ORDER — KETOPROFEN POWD: Status: DC

## 2011-06-07 NOTE — Progress Notes (Signed)
  Subjective:    Patient ID: Elaine Morris, female    DOB: 09/23/1980, 31 y.o.   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  She has tried a wrist brace and a thumb spica but these have just made the wrist stiff.  She got some relief with OTC NSAIDs with some relief but not resolution.  Occ night pain.  She is a new mom so does spend time carrying a car seat, which she finds painful.  Reaching out and lifting are also painful.   Review of Systems     Objective:   Physical Exam  Left wrist with normal range of motion, no swelling, and no deformity There is tenderness along the flexor pollicis longus mostly isolated to the wrist The forearm and thumb proper are non-tender Positive finkelstein's  No tenderness or swelling of the thenar imminence Normal strength       Assessment & Plan:

## 2011-06-07 NOTE — Assessment & Plan Note (Addendum)
Discussed treatment options to include oral anti-inflammatory meds, injection, and topical anti-inflammatory.  She would like to be conservative so we will start with topicals (ketoprofen gel).  ROM several times a day is important.  Cautioned against overuse of the thumb spica as this will increase her stiffness.  She will use this with activity but not wear round the clock.  If she continues to have pain we will consider injection.

## 2013-01-22 ENCOUNTER — Ambulatory Visit (INDEPENDENT_AMBULATORY_CARE_PROVIDER_SITE_OTHER): Payer: Self-pay | Admitting: Family Medicine

## 2013-01-22 DIAGNOSIS — Z23 Encounter for immunization: Secondary | ICD-10-CM

## 2014-02-19 ENCOUNTER — Ambulatory Visit
Admission: RE | Admit: 2014-02-19 | Discharge: 2014-02-19 | Disposition: A | Discharge status: Home / Self Care | Payer: 59 | Source: Ambulatory Visit | Attending: Family Medicine | Admitting: Family Medicine

## 2014-02-19 ENCOUNTER — Other Ambulatory Visit: Payer: Self-pay | Admitting: Family Medicine

## 2014-02-19 DIAGNOSIS — J209 Acute bronchitis, unspecified: Secondary | ICD-10-CM

## 2014-02-23 ENCOUNTER — Encounter (HOSPITAL_COMMUNITY): Payer: Self-pay | Admitting: Obstetrics and Gynecology

## 2015-04-27 ENCOUNTER — Other Ambulatory Visit: Payer: Self-pay | Admitting: Family Medicine

## 2015-04-27 ENCOUNTER — Encounter: Payer: Self-pay | Admitting: Family Medicine

## 2015-04-27 DIAGNOSIS — M5126 Other intervertebral disc displacement, lumbar region: Secondary | ICD-10-CM

## 2015-04-27 MED ORDER — PREDNISONE 20 MG PO TABS: ORAL_TABLET | ORAL | Status: DC

## 2015-04-27 NOTE — Progress Notes (Signed)
   Subjective:    Patient ID: Elaine Morris, female    DOB: [birth year], 35 y.o.   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  This herniated disc was treated conservatively with prednisone and gradually improved over time. This weekend she reinjured her back and reports severe pain in the lumbar spine. She has no symptoms of cauda equina syndrome. She denies any leg weakness or numbness or tingling in the legs. She denies any saddle anesthesia. She's been taking ibuprofen with no improvement in her symptoms. Past she did well with a prednisone taper pack. Past Medical History  Diagnosis Date  . Seasonal allergies   . Herniated disc L5  . No pertinent past medical history   . Kidney stone    Past Surgical History  Procedure Laterality Date  . Partial right oopharectomy  2008  . Right oophorectomy      partial   . Cesarean section  03/01/2011    Procedure: CESAREAN SECTION;  Surgeon: Meriel Picaichard M Holland;  Location: [facility];  Service: Gynecology;  Laterality: N/A;   Current Outpatient Prescriptions on File Prior to Visit  Medication Sig Dispense Refill  . ibuprofen (ADVIL,MOTRIN) 600 MG tablet Take 600 mg by mouth every 6 (six) hours as needed.    . Ketoprofen POWD Compound 20% gel.  AAA TID-QID 500 g 0  . prenatal vitamin w/FE, FA (PRENATAL 1 + 1) 27-1 MG TABS Take 1 tablet by mouth daily.       No current facility-administered medications on file prior to visit.   No Known Allergies Social History   Social History  . Marital Status: Married    Spouse Name: nathan  . Number of Children: 0  . Years of Education: N/A   Occupational History  . Physician    Social History Main Topics  . Smoking status: Never Smoker   . Smokeless tobacco: Not on file  . Alcohol Use: No  . Drug Use: No  . Sexual Activity: Not on file   Other Topics  Concern  . Not on file   Social History Narrative      Review of Systems  All other systems reviewed and are negative.      Objective:   Physical Exam  Cardiovascular: Normal rate and regular rhythm.   Pulmonary/Chest: Effort normal and breath sounds normal.  Neurological: She has normal reflexes. She exhibits normal muscle tone. Coordination normal.  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

## 2016-01-24 ENCOUNTER — Other Ambulatory Visit: Payer: Self-pay | Admitting: Family Medicine

## 2016-01-24 MED ORDER — AMOXICILLIN 875 MG PO TABS: 875.0000 mg | ORAL_TABLET | Freq: Two times a day (BID) | ORAL | 0 refills | Status: DC

## 2016-01-24 NOTE — Progress Notes (Signed)
My partner has documented strep throat. Throat is still erythematous with white exudate in the tonsillar crypts with fever and sore throat. Will treat with amoxicillin 875 twice a day for 10 days

## 2016-07-12 ENCOUNTER — Other Ambulatory Visit: Payer: Self-pay | Admitting: Family Medicine

## 2016-07-12 DIAGNOSIS — M5127 Other intervertebral disc displacement, lumbosacral region: Secondary | ICD-10-CM

## 2016-07-12 DIAGNOSIS — M5431 Sciatica, right side: Secondary | ICD-10-CM

## 2016-07-20 ENCOUNTER — Ambulatory Visit
Admission: RE | Admit: 2016-07-20 | Discharge: 2016-07-20 | Disposition: A | Discharge status: Home / Self Care | Payer: No Typology Code available for payment source | Source: Ambulatory Visit | Attending: Family Medicine | Admitting: Family Medicine

## 2016-07-20 DIAGNOSIS — M5431 Sciatica, right side: Secondary | ICD-10-CM

## 2016-07-20 DIAGNOSIS — M5127 Other intervertebral disc displacement, lumbosacral region: Secondary | ICD-10-CM

## 2017-06-07 LAB — OB RESULTS CONSOLE GC/CHLAMYDIA: Gonorrhea: NEGATIVE

## 2017-06-07 LAB — OB RESULTS CONSOLE HIV ANTIBODY (ROUTINE TESTING): HIV: NONREACTIVE

## 2017-06-07 LAB — OB RESULTS CONSOLE HEPATITIS B SURFACE ANTIGEN: Hepatitis B Surface Ag: NEGATIVE

## 2017-06-07 LAB — OB RESULTS CONSOLE RUBELLA ANTIBODY, IGM: RUBELLA: IMMUNE

## 2017-06-07 LAB — OB RESULTS CONSOLE ANTIBODY SCREEN: ANTIBODY SCREEN: NEGATIVE

## 2017-06-07 LAB — OB RESULTS CONSOLE RPR: RPR: NONREACTIVE

## 2017-06-07 LAB — OB RESULTS CONSOLE ABO/RH: RH Type: POSITIVE

## 2017-06-17 ENCOUNTER — Inpatient Hospital Stay (HOSPITAL_COMMUNITY): Payer: No Typology Code available for payment source

## 2017-06-17 ENCOUNTER — Inpatient Hospital Stay (HOSPITAL_COMMUNITY)
Admission: AD | Admit: 2017-06-17 | Discharge: 2017-06-17 | Disposition: A | Discharge status: Home / Self Care | Payer: No Typology Code available for payment source | Source: Ambulatory Visit | Attending: Obstetrics and Gynecology | Admitting: Obstetrics and Gynecology

## 2017-06-17 ENCOUNTER — Encounter (HOSPITAL_COMMUNITY): Payer: Self-pay

## 2017-06-17 DIAGNOSIS — Z3A09 9 weeks gestation of pregnancy: Secondary | ICD-10-CM | POA: Diagnosis not present

## 2017-06-17 DIAGNOSIS — R109 Unspecified abdominal pain: Secondary | ICD-10-CM | POA: Insufficient documentation

## 2017-06-17 DIAGNOSIS — O209 Hemorrhage in early pregnancy, unspecified: Secondary | ICD-10-CM | POA: Diagnosis not present

## 2017-06-17 LAB — ABO/RH: ABO/RH(D): O POS

## 2017-06-17 LAB — URINALYSIS, ROUTINE W REFLEX MICROSCOPIC
Bacteria, UA: NONE SEEN
Bilirubin Urine: NEGATIVE
Glucose, UA: NEGATIVE mg/dL
Ketones, ur: NEGATIVE mg/dL
Leukocytes, UA: NEGATIVE
Nitrite: NEGATIVE
PH: 6 (ref 5.0–8.0)
Protein, ur: NEGATIVE mg/dL
SPECIFIC GRAVITY, URINE: 1.014 (ref 1.005–1.030)

## 2017-06-17 LAB — CBC
HEMATOCRIT: 35.5 % — AB (ref 36.0–46.0)
HEMOGLOBIN: 11.7 g/dL — AB (ref 12.0–15.0)
MCH: 26.7 pg (ref 26.0–34.0)
MCHC: 33 g/dL (ref 30.0–36.0)
MCV: 81.1 fL (ref 78.0–100.0)
Platelets: 333 10*3/uL (ref 150–400)
RBC: 4.38 MIL/uL (ref 3.87–5.11)
RDW: 13.7 % (ref 11.5–15.5)
WBC: 8.8 10*3/uL (ref 4.0–10.5)

## 2017-06-17 LAB — WET PREP, GENITAL
CLUE CELLS WET PREP: NONE SEEN
SPERM: NONE SEEN
Trich, Wet Prep: NONE SEEN
Yeast Wet Prep HPF POC: NONE SEEN

## 2017-06-17 LAB — POCT PREGNANCY, URINE: Preg Test, Ur: POSITIVE — AB

## 2017-06-17 NOTE — MAU Note (Signed)
Saw bright red blood when using restroom this morning. Just spotting since. Mild cramps since preg. New vaginal discharge x2 days, fishy odor.

## 2017-06-17 NOTE — MAU Provider Note (Signed)
Patient Elaine Morris is a 37 y.o. G3P1011 At 5659w5d  Here with complaints of vaginal bleeding this morning at 630am.   History     CSN: [id number]  Arrival date and time: 06/17/17 [id number]   None     Chief Complaint  Patient presents with  . Abdominal Pain  . Vaginal Bleeding  . Vaginal Discharge   Abdominal Pain  This is a chronic problem. The problem occurs intermittently. The problem has been unchanged. Pertinent negatives include no constipation or diarrhea.  Vaginal Bleeding  The patient's primary symptoms include vaginal discharge. This is a new problem. The current episode started today. The problem has been resolved. The pain is mild. Associated symptoms include abdominal pain. Pertinent negatives include no constipation or diarrhea.  Vaginal Discharge  The patient's primary symptoms include vaginal discharge. Associated symptoms include abdominal pain. Pertinent negatives include no constipation or diarrhea.    OB History    Gravida Para Term Preterm AB Living   3 1 1   1 1    SAB TAB Ectopic Multiple Live Births   1       1      Past Medical History:  Diagnosis Date  . Herniated disc L5  . Kidney stone   . No pertinent past medical history   . Seasonal allergies     Past Surgical History:  Procedure Laterality Date  . CESAREAN SECTION  03/01/2011   Procedure: CESAREAN SECTION;  Surgeon: Meriel Picaichard M Holland;  Location: [facility];  Service: Gynecology;  Laterality: N/A;  . partial right oopharectomy  2008  . RIGHT OOPHORECTOMY     partial     Family History  Problem Relation Age of Onset  . Asthma Mother   . Deep vein thrombosis Mother        dvt  . Hyperthyroidism Mother   . Melanoma Unknown        mgf    Social History   Tobacco Use  . Smoking status: Never Smoker  . Smokeless tobacco: Never Used  Substance Use Topics  . Alcohol use: No  . Drug use: No    Allergies: No Known Allergies  Medications Prior to Admission  Medication Sig Dispense  Refill Last Dose  . amoxicillin (AMOXIL) 875 MG tablet Take 1 tablet (875 mg total) by mouth 2 (two) times daily. 20 tablet 0   . ibuprofen (ADVIL,MOTRIN) 600 MG tablet Take 600 mg by mouth every 6 (six) hours as needed.     . Ketoprofen POWD Compound 20% gel.  AAA TID-QID 500 g 0   . predniSONE (DELTASONE) 20 MG tablet 3 tabs poqday 1-2, 2 tabs poqday 3-4, 1 tab poqday 5-6 12 tablet 0   . prenatal vitamin w/FE, FA (PRENATAL 1 + 1) 27-1 MG TABS Take 1 tablet by mouth daily.     02/28/2011 at Unknown    Review of Systems  Constitutional: Negative.   HENT: Negative.   Respiratory: Negative.   Cardiovascular: Negative.   Gastrointestinal: Positive for abdominal pain. Negative for constipation and diarrhea.  Genitourinary: Positive for vaginal bleeding and vaginal discharge.  Neurological: Negative.   Hematological: Negative.   Psychiatric/Behavioral: Negative.    Physical Exam   Blood pressure 127/75, pulse 73, temperature 98.7 F (37.1 C), temperature source Oral, resp. rate 18, height 5\' 3"  (1.6 m), weight 182 lb 8 oz (82.8 kg), last menstrual period 04/10/2017.  Physical Exam  Constitutional: She is oriented to person, place, and time. She appears well-developed.  HENT:  Head: Normocephalic.  Neck: Normal range of motion.  GI: Soft.  Genitourinary:  Genitourinary Comments: NEFG; scant amount of dark blood in the vagina; no active bleeding. No other discharge in the vagina. Cervix is closed and posterior. No CMT, suprapubic or adnexal pain.   Musculoskeletal: Normal range of motion.  Neurological: She is alert and oriented to person, place, and time.  Skin: Skin is warm and dry.  Psychiatric: She has a normal mood and affect.    MAU Course  Procedures  MDM -US shows [place] with cardiac activity at 167 -No SCH identified -ABO (O positive) Patient imaging reviewed independently by me. Imaging, lab results and physical exam discussed with Dr. Vincente Poli, who agrees patient stable for  discharge.   Assessment and Plan   1. Vaginal bleeding before [redacted] weeks gestation    2. Patient stable for discharge with strict return precautions.  3.  Patient to keep appointment at [facility] on Thursday.  4. All questions answered.   Elaine Morris Elaine Morris 06/17/2017, 8:44 AM

## 2017-06-17 NOTE — Discharge Instructions (Signed)
Pelvic Rest °Pelvic rest may be recommended if: °· Your placenta is partially or completely covering the opening of your cervix (placenta previa). °· There is bleeding between the wall of the uterus and the amniotic sac in the first trimester of pregnancy (subchorionic hemorrhage). °· You went into labor too early (preterm labor). ° °Based on your overall health and the health of your baby, your health care provider will decide if pelvic rest is right for you. °How do I rest my pelvis? °For as long as told by your health care provider: °· Do not have sex, sexual stimulation, or an orgasm. °· Do not use tampons. Do not douche. Do not put anything in your vagina. °· Do not lift anything that is heavier than 10 lb (4.5 kg). °· Avoid activities that take a lot of effort (are strenuous). °· Avoid any activity in which your pelvic muscles could become strained. ° °When should I seek medical care? °Seek medical care if you have: °· Cramping pain in your lower abdomen. °· Vaginal discharge. °· A low, dull backache. °· Regular contractions. °· Uterine tightening. ° °When should I seek immediate medical care? °Seek immediate medical care if: °· You have vaginal bleeding and you are pregnant. ° °This information is not intended to replace advice given to you by your health care provider. Make sure you discuss any questions you have with your health care provider. °Document Released: 08/05/2010 Document Revised: 09/16/2015 Document Reviewed: 10/12/2014 °Elsevier Interactive Patient Education © 2018 [employer] ° °

## 2017-06-18 LAB — GC/CHLAMYDIA PROBE AMP (~~LOC~~) NOT AT ARMC
CHLAMYDIA, DNA PROBE: NEGATIVE
NEISSERIA GONORRHEA: NEGATIVE

## 2017-09-13 ENCOUNTER — Other Ambulatory Visit: Payer: Self-pay | Admitting: Obstetrics and Gynecology

## 2017-09-13 ENCOUNTER — Ambulatory Visit (HOSPITAL_COMMUNITY)
Admission: RE | Admit: 2017-09-13 | Discharge: 2017-09-13 | Disposition: A | Discharge status: Home / Self Care | Payer: No Typology Code available for payment source | Source: Ambulatory Visit | Attending: Obstetrics and Gynecology | Admitting: Obstetrics and Gynecology

## 2017-09-13 DIAGNOSIS — O26892 Other specified pregnancy related conditions, second trimester: Secondary | ICD-10-CM | POA: Insufficient documentation

## 2017-09-13 DIAGNOSIS — R0602 Shortness of breath: Secondary | ICD-10-CM | POA: Insufficient documentation

## 2017-09-13 DIAGNOSIS — R059 Cough, unspecified: Secondary | ICD-10-CM

## 2017-09-13 DIAGNOSIS — R05 Cough: Secondary | ICD-10-CM

## 2017-09-13 DIAGNOSIS — Z3A22 22 weeks gestation of pregnancy: Secondary | ICD-10-CM | POA: Insufficient documentation

## 2017-10-15 ENCOUNTER — Inpatient Hospital Stay (HOSPITAL_COMMUNITY)
Admission: AD | Admit: 2017-10-15 | Discharge: 2017-10-15 | Disposition: A | Discharge status: Home / Self Care | Payer: No Typology Code available for payment source | Source: Ambulatory Visit | Attending: Obstetrics & Gynecology | Admitting: Obstetrics & Gynecology

## 2017-10-15 ENCOUNTER — Encounter (HOSPITAL_COMMUNITY): Payer: Self-pay | Admitting: *Deleted

## 2017-10-15 DIAGNOSIS — Z8349 Family history of other endocrine, nutritional and metabolic diseases: Secondary | ICD-10-CM | POA: Diagnosis not present

## 2017-10-15 DIAGNOSIS — O4702 False labor before 37 completed weeks of gestation, second trimester: Secondary | ICD-10-CM

## 2017-10-15 DIAGNOSIS — O99212 Obesity complicating pregnancy, second trimester: Secondary | ICD-10-CM | POA: Diagnosis not present

## 2017-10-15 DIAGNOSIS — Z87442 Personal history of urinary calculi: Secondary | ICD-10-CM | POA: Insufficient documentation

## 2017-10-15 DIAGNOSIS — Z3A26 26 weeks gestation of pregnancy: Secondary | ICD-10-CM | POA: Insufficient documentation

## 2017-10-15 DIAGNOSIS — O34219 Maternal care for unspecified type scar from previous cesarean delivery: Secondary | ICD-10-CM | POA: Diagnosis not present

## 2017-10-15 DIAGNOSIS — Z832 Family history of diseases of the blood and blood-forming organs and certain disorders involving the immune mechanism: Secondary | ICD-10-CM | POA: Diagnosis not present

## 2017-10-15 DIAGNOSIS — Z90721 Acquired absence of ovaries, unilateral: Secondary | ICD-10-CM | POA: Insufficient documentation

## 2017-10-15 DIAGNOSIS — Z79899 Other long term (current) drug therapy: Secondary | ICD-10-CM | POA: Insufficient documentation

## 2017-10-15 DIAGNOSIS — Z808 Family history of malignant neoplasm of other organs or systems: Secondary | ICD-10-CM | POA: Diagnosis not present

## 2017-10-15 DIAGNOSIS — Z7951 Long term (current) use of inhaled steroids: Secondary | ICD-10-CM | POA: Diagnosis not present

## 2017-10-15 DIAGNOSIS — Z3689 Encounter for other specified antenatal screening: Secondary | ICD-10-CM

## 2017-10-15 DIAGNOSIS — Z825 Family history of asthma and other chronic lower respiratory diseases: Secondary | ICD-10-CM | POA: Insufficient documentation

## 2017-10-15 DIAGNOSIS — O09522 Supervision of elderly multigravida, second trimester: Secondary | ICD-10-CM | POA: Insufficient documentation

## 2017-10-15 DIAGNOSIS — R109 Unspecified abdominal pain: Secondary | ICD-10-CM | POA: Diagnosis present

## 2017-10-15 LAB — WET PREP, GENITAL
CLUE CELLS WET PREP: NONE SEEN
SPERM: NONE SEEN
TRICH WET PREP: NONE SEEN
Yeast Wet Prep HPF POC: NONE SEEN

## 2017-10-15 LAB — URINALYSIS, ROUTINE W REFLEX MICROSCOPIC
BILIRUBIN URINE: NEGATIVE
Glucose, UA: NEGATIVE mg/dL
Hgb urine dipstick: NEGATIVE
Ketones, ur: NEGATIVE mg/dL
LEUKOCYTES UA: NEGATIVE
NITRITE: NEGATIVE
PH: 7 (ref 5.0–8.0)
Protein, ur: NEGATIVE mg/dL
SPECIFIC GRAVITY, URINE: 1.004 — AB (ref 1.005–1.030)

## 2017-10-15 MED ORDER — NIFEDIPINE 10 MG PO CAPS
10.0000 mg | ORAL_CAPSULE | ORAL | Status: AC
  Administered 2017-10-15 (×2): 10 mg via ORAL
  Filled 2017-10-15 (×2): qty 1

## 2017-10-15 NOTE — MAU Note (Signed)
PT SAYS HAS HAD BRAXTON HICKS FOR FEW WEEKS  BUT  TONIGHT CLOSER .  [facility]   WITH DR ADKINS-  DID NOT CALL DR.  LAST SEX-SINCE April.

## 2017-10-15 NOTE — MAU Provider Note (Signed)
History     CSN: [id number]  Arrival date and time: 10/15/17 2002   First Provider Initiated Contact with Patient 10/15/17 2043      Chief Complaint  Patient presents with  . Contractions   G3P1011 @26 .6 wks here with ctx. Reports ctx started this am but became consistent around 6pm. Frequency is q10-15 min. Denies VB, LOF, or discharge. Good FM. Denies urinary sx. Feels well hydrated.    OB History    Gravida  3   Para  1   Term  1   Preterm      AB  1   Living  1     SAB  1   TAB      Ectopic      Multiple      Live Births  1           Past Medical History:  Diagnosis Date  . Herniated disc L5  . Kidney stone   . No pertinent past medical history   . Seasonal allergies     Past Surgical History:  Procedure Laterality Date  . CESAREAN SECTION  03/01/2011   Procedure: CESAREAN SECTION;  Surgeon: Meriel Picaichard M Holland;  Location: [facility];  Service: Gynecology;  Laterality: N/A;  . partial right oopharectomy  2008  . RIGHT OOPHORECTOMY     partial     Family History  Problem Relation Age of Onset  . Asthma Mother   . Deep vein thrombosis Mother        dvt  . Hyperthyroidism Mother   . Melanoma Unknown        mgf    Social History   Tobacco Use  . Smoking status: Never Smoker  . Smokeless tobacco: Never Used  Substance Use Topics  . Alcohol use: No  . Drug use: No    Allergies: No Known Allergies  Medications Prior to Admission  Medication Sig Dispense Refill Last Dose  . cetirizine (ZYRTEC) 10 MG tablet Take 10 mg by mouth daily.   10/15/2017 at Unknown time  . fluticasone (FLONASE) 50 MCG/ACT nasal spray Place 1 spray into both nostrils daily.   10/15/2017 at Unknown time  . prenatal vitamin w/FE, FA (PRENATAL 1 + 1) 27-1 MG TABS Take 1 tablet by mouth daily.     10/15/2017 at Unknown time  . ranitidine (ZANTAC) 150 MG tablet Take 150 mg by mouth 2 (two) times daily.   10/15/2017 at Unknown time  . doxylamine, Sleep, (UNISOM) 25 MG  tablet Take 25 mg by mouth at bedtime as needed for sleep.   06/16/2017 at Unknown time  . polyethylene glycol (MIRALAX / GLYCOLAX) packet Take 17 g by mouth daily as needed for mild constipation or moderate constipation.    Past Week at Unknown time    Review of Systems  Gastrointestinal: Positive for abdominal pain. Negative for constipation, diarrhea, nausea and vomiting.  Genitourinary: Negative for dysuria, hematuria, urgency, vaginal bleeding and vaginal discharge.   Physical Exam   Blood pressure 122/74, pulse 93, temperature 98.8 F (37.1 C), temperature source Oral, resp. rate 18, height 5\' 3"  (1.6 m), weight 205 lb 4 oz (93.1 kg), last menstrual period 04/10/2017.  Physical Exam  Constitutional: She is oriented to person, place, and time. She appears well-developed and well-nourished. No distress.  HENT:  Head: Normocephalic and atraumatic.  Neck: Normal range of motion.  Cardiovascular: Normal rate.  Respiratory: Effort normal.  GI: Soft. She exhibits no distension. There is no tenderness.  gravid  Genitourinary:  Genitourinary Comments: SVE: closed/long  Musculoskeletal: Normal range of motion.  Neurological: She is alert and oriented to person, place, and time.  Skin: Skin is warm and dry.  Psychiatric: She has a normal mood and affect.  EFM: 145 bpm, mod variability, + accels, no decels Toco: 5-7  Results for orders placed or performed during the hospital encounter of 10/15/17 (from the past 24 hour(s))  Urinalysis, Routine w reflex microscopic     Status: Abnormal   Collection Time: 10/15/17  8:33 PM  Result Value Ref Range   Color, Urine STRAW (A) YELLOW   APPearance CLEAR CLEAR   Specific Gravity, Urine 1.004 (L) 1.005 - 1.030   pH 7.0 5.0 - 8.0   Glucose, UA NEGATIVE NEGATIVE mg/dL   Hgb urine dipstick NEGATIVE NEGATIVE   Bilirubin Urine NEGATIVE NEGATIVE   Ketones, ur NEGATIVE NEGATIVE mg/dL   Protein, ur NEGATIVE NEGATIVE mg/dL   Nitrite NEGATIVE  NEGATIVE   Leukocytes, UA NEGATIVE NEGATIVE  Wet prep, genital     Status: Abnormal   Collection Time: 10/15/17  8:45 PM  Result Value Ref Range   Yeast Wet Prep HPF POC NONE SEEN NONE SEEN   Trich, Wet Prep NONE SEEN NONE SEEN   Clue Cells Wet Prep HPF POC NONE SEEN NONE SEEN   WBC, Wet Prep HPF POC FEW (A) NONE SEEN   Sperm NONE SEEN    MAU Course  Procedures Procardia x2  MDM Prenatal records reviewed. Pregnancy is complicated by AMA, obesity, increased SMA carrier risk, SCH in early pregnancy. Labs ordered and reviewed. Pt feels better after hydration and Procardia. Ctx irregular now. No evidence of UTI, infection, or PTL. Presentation, clinical findings, and plan discussed with Dr. Langston Masker. Stable for discharge home.  Assessment and Plan   1. [redacted] weeks gestation of pregnancy   2. NST (non-stress test) reactive   3. Preterm uterine contractions in second trimester, antepartum    Discharge home Follow up in OB office this week PTL precautions Hydrate Rest  Allergies as of 10/15/2017   No Known Allergies     Medication List    TAKE these medications   cetirizine 10 MG tablet Commonly known as:  ZYRTEC Take 10 mg by mouth daily.   fluticasone 50 MCG/ACT nasal spray Commonly known as:  FLONASE Place 1 spray into both nostrils daily.   polyethylene glycol packet Commonly known as:  MIRALAX / GLYCOLAX Take 17 g by mouth daily as needed for mild constipation or moderate constipation.   prenatal vitamin w/FE, FA 27-1 MG Tabs tablet Take 1 tablet by mouth daily.   ranitidine 150 MG tablet Commonly known as:  ZANTAC Take 150 mg by mouth 2 (two) times daily.   UNISOM 25 MG tablet Generic drug:  doxylamine (Sleep) Take 25 mg by mouth at bedtime as needed for sleep.      Donette Larry, CNM 10/15/2017, 8:52 PM

## 2017-10-15 NOTE — Discharge Instructions (Signed)
Braxton Hicks Contractions °Contractions of the uterus can occur throughout pregnancy, but they are not always a sign that you are in labor. You may have practice contractions called Braxton Hicks contractions. These false labor contractions are sometimes confused with true labor. °What are Braxton Hicks contractions? °Braxton Hicks contractions are tightening movements that occur in the muscles of the uterus before labor. Unlike true labor contractions, these contractions do not result in opening (dilation) and thinning of the cervix. Toward the end of pregnancy (32-34 weeks), Braxton Hicks contractions can happen more often and may become stronger. These contractions are sometimes difficult to tell apart from true labor because they can be very uncomfortable. You should not feel embarrassed if you go to the hospital with false labor. °Sometimes, the only way to tell if you are in true labor is for your health care provider to look for changes in the cervix. The health care provider will do a physical exam and may monitor your contractions. If you are not in true labor, the exam should show that your cervix is not dilating and your water has not broken. °If there are other health problems associated with your pregnancy, it is completely safe for you to be sent home with false labor. You may continue to have Braxton Hicks contractions until you go into true labor. °How to tell the difference between true labor and false labor °True labor °· Contractions last 30-70 seconds. °· Contractions become very regular. °· Discomfort is usually felt in the top of the uterus, and it spreads to the lower abdomen and low back. °· Contractions do not go away with walking. °· Contractions usually become more intense and increase in frequency. °· The cervix dilates and gets thinner. °False labor °· Contractions are usually shorter and not as strong as true labor contractions. °· Contractions are usually irregular. °· Contractions  are often felt in the front of the lower abdomen and in the groin. °· Contractions may go away when you walk around or change positions while lying down. °· Contractions get weaker and are shorter-lasting as time goes on. °· The cervix usually does not dilate or become thin. °Follow these instructions at home: °· Take over-the-counter and prescription medicines only as told by your health care provider. °· Keep up with your usual exercises and follow other instructions from your health care provider. °· Eat and drink lightly if you think you are going into labor. °· If Braxton Hicks contractions are making you uncomfortable: °? Change your position from lying down or resting to walking, or change from walking to resting. °? Sit and rest in a tub of warm water. °? Drink enough fluid to keep your urine pale yellow. Dehydration may cause these contractions. °? Do slow and deep breathing several times an hour. °· Keep all follow-up prenatal visits as told by your health care provider. This is important. °Contact a health care provider if: °· You have a fever. °· You have continuous pain in your abdomen. °Get help right away if: °· Your contractions become stronger, more regular, and closer together. °· You have fluid leaking or gushing from your vagina. °· You pass blood-tinged mucus (bloody show). °· You have bleeding from your vagina. °· You have low back pain that you never had before. °· You feel your baby’s head pushing down and causing pelvic pressure. °· Your baby is not moving inside you as much as it used to. °Summary °· Contractions that occur before labor are called Braxton   Hicks contractions, false labor, or practice contractions. °· Braxton Hicks contractions are usually shorter, weaker, farther apart, and less regular than true labor contractions. True labor contractions usually become progressively stronger and regular and they become more frequent. °· Manage discomfort from Braxton Hicks contractions by  changing position, resting in a warm bath, drinking plenty of water, or practicing deep breathing. °This information is not intended to replace advice given to you by your health care provider. Make sure you discuss any questions you have with your health care provider. °Document Released: 08/24/2016 Document Revised: 08/24/2016 Document Reviewed: 08/24/2016 °Elsevier Interactive Patient Education © 2018 [employer] ° °

## 2017-10-16 LAB — GC/CHLAMYDIA PROBE AMP (~~LOC~~) NOT AT ARMC
CHLAMYDIA, DNA PROBE: NEGATIVE
NEISSERIA GONORRHEA: NEGATIVE

## 2017-12-18 ENCOUNTER — Inpatient Hospital Stay (HOSPITAL_COMMUNITY)
Admission: AD | Admit: 2017-12-18 | Discharge: 2017-12-18 | Disposition: A | Discharge status: Home / Self Care | Payer: No Typology Code available for payment source | Source: Ambulatory Visit | Attending: Obstetrics and Gynecology | Admitting: Obstetrics and Gynecology

## 2017-12-18 ENCOUNTER — Encounter (HOSPITAL_COMMUNITY): Payer: Self-pay | Admitting: *Deleted

## 2017-12-18 DIAGNOSIS — Z3A36 36 weeks gestation of pregnancy: Secondary | ICD-10-CM | POA: Insufficient documentation

## 2017-12-18 DIAGNOSIS — O479 False labor, unspecified: Secondary | ICD-10-CM

## 2017-12-18 HISTORY — DX: Anemia, unspecified: D64.9

## 2017-12-18 LAB — URINALYSIS, ROUTINE W REFLEX MICROSCOPIC
Bilirubin Urine: NEGATIVE
GLUCOSE, UA: NEGATIVE mg/dL
Ketones, ur: NEGATIVE mg/dL
Nitrite: NEGATIVE
PROTEIN: NEGATIVE mg/dL
Specific Gravity, Urine: 1.014 (ref 1.005–1.030)
pH: 7 (ref 5.0–8.0)

## 2017-12-18 NOTE — Discharge Instructions (Signed)
Braxton Hicks Contractions °Contractions of the uterus can occur throughout pregnancy, but they are not always a sign that you are in labor. You may have practice contractions called Braxton Hicks contractions. These false labor contractions are sometimes confused with true labor. °What are Braxton Hicks contractions? °Braxton Hicks contractions are tightening movements that occur in the muscles of the uterus before labor. Unlike true labor contractions, these contractions do not result in opening (dilation) and thinning of the cervix. Toward the end of pregnancy (32-34 weeks), Braxton Hicks contractions can happen more often and may become stronger. These contractions are sometimes difficult to tell apart from true labor because they can be very uncomfortable. You should not feel embarrassed if you go to the hospital with false labor. °Sometimes, the only way to tell if you are in true labor is for your health care provider to look for changes in the cervix. The health care provider will do a physical exam and may monitor your contractions. If you are not in true labor, the exam should show that your cervix is not dilating and your water has not broken. °If there are other health problems associated with your pregnancy, it is completely safe for you to be sent home with false labor. You may continue to have Braxton Hicks contractions until you go into true labor. °How to tell the difference between true labor and false labor °True labor °· Contractions last 30-70 seconds. °· Contractions become very regular. °· Discomfort is usually felt in the top of the uterus, and it spreads to the lower abdomen and low back. °· Contractions do not go away with walking. °· Contractions usually become more intense and increase in frequency. °· The cervix dilates and gets thinner. °False labor °· Contractions are usually shorter and not as strong as true labor contractions. °· Contractions are usually irregular. °· Contractions  are often felt in the front of the lower abdomen and in the groin. °· Contractions may go away when you walk around or change positions while lying down. °· Contractions get weaker and are shorter-lasting as time goes on. °· The cervix usually does not dilate or become thin. °Follow these instructions at home: °· Take over-the-counter and prescription medicines only as told by your health care provider. °· Keep up with your usual exercises and follow other instructions from your health care provider. °· Eat and drink lightly if you think you are going into labor. °· If Braxton Hicks contractions are making you uncomfortable: °? Change your position from lying down or resting to walking, or change from walking to resting. °? Sit and rest in a tub of warm water. °? Drink enough fluid to keep your urine pale yellow. Dehydration may cause these contractions. °? Do slow and deep breathing several times an hour. °· Keep all follow-up prenatal visits as told by your health care provider. This is important. °Contact a health care provider if: °· You have a fever. °· You have continuous pain in your abdomen. °Get help right away if: °· Your contractions become stronger, more regular, and closer together. °· You have fluid leaking or gushing from your vagina. °· You pass blood-tinged mucus (bloody show). °· You have bleeding from your vagina. °· You have low back pain that you never had before. °· You feel your baby’s head pushing down and causing pelvic pressure. °· Your baby is not moving inside you as much as it used to. °Summary °· Contractions that occur before labor are called Braxton   Hicks contractions, false labor, or practice contractions. °· Braxton Hicks contractions are usually shorter, weaker, farther apart, and less regular than true labor contractions. True labor contractions usually become progressively stronger and regular and they become more frequent. °· Manage discomfort from Braxton Hicks contractions by  changing position, resting in a warm bath, drinking plenty of water, or practicing deep breathing. °This information is not intended to replace advice given to you by your health care provider. Make sure you discuss any questions you have with your health care provider. °Document Released: 08/24/2016 Document Revised: 08/24/2016 Document Reviewed: 08/24/2016 °Elsevier Interactive Patient Education © 2018 [employer] ° °

## 2017-12-18 NOTE — MAU Note (Signed)
Pt C/O uc's for "weeks", stopped procardia last week.  Contractions are now every 5-10 minutes, has had light spotting, increased pelvic pressure, denies LOF.  Reports good fetal movement.

## 2017-12-27 ENCOUNTER — Encounter (HOSPITAL_COMMUNITY): Payer: Self-pay

## 2017-12-27 ENCOUNTER — Telehealth (HOSPITAL_COMMUNITY): Payer: Self-pay | Admitting: *Deleted

## 2017-12-27 NOTE — Telephone Encounter (Signed)
Preadmission screen  

## 2017-12-31 ENCOUNTER — Encounter (HOSPITAL_COMMUNITY): Payer: Self-pay

## 2017-12-31 NOTE — H&P (Signed)
Elaine Morris is a 37 y.o. female presenting for repeat c-section.  Pregnancy uncomplicated. OB History    Gravida  3   Para  1   Term  1   Preterm      AB  1   Living  1     SAB  1   TAB      Ectopic      Multiple      Live Births  1          Past Medical History:  Diagnosis Date  . Anemia   . Herniated disc L5  . Kidney stone   . No pertinent past medical history   . Seasonal allergies    Past Surgical History:  Procedure Laterality Date  . CESAREAN SECTION  03/01/2011   Procedure: CESAREAN SECTION;  Surgeon: Meriel Pica;  Location: [facility];  Service: Gynecology;  Laterality: N/A;  . partial right oopharectomy  2008  . RIGHT OOPHORECTOMY     partial    Family History: family history includes Asthma in her mother; Deep vein thrombosis in her mother; Hyperthyroidism in her mother; Melanoma in her maternal grandfather. Social History:  reports that she has never smoked. She has never used smokeless tobacco. She reports that she does not drink alcohol or use drugs.     Maternal Diabetes: No Genetic Screening: Normal Maternal Ultrasounds/Referrals: Normal Fetal Ultrasounds or other Referrals:  None Maternal Substance Abuse:  No Significant Maternal Medications:  None Significant Maternal Lab Results:  SMA carrier risk, FOB neg Other Comments:  None  ROS History   Last menstrual period 04/10/2017. Exam Physical Exam  Gen - NAD ABd - gravid, NT Ext - NT CV - RRR Lungs - clear Prenatal labs: ABO, Rh: --/--/O POS Performed at [facility], [address], [place], [place] [address]  [phone] Antibody: Negative (02/14 0000) Rubella: Immune (02/14 0000) RPR: Nonreactive (02/14 0000)  HBsAg: Negative (02/14 0000)  HIV: Non-reactive (02/14 0000)  GBS:     Assessment/Plan: Previous c-section - for repeat   Zelphia Cairo 12/31/2017, 9:08 AM

## 2018-01-07 ENCOUNTER — Encounter (HOSPITAL_COMMUNITY)
Admission: RE | Admit: 2018-01-07 | Discharge: 2018-01-07 | Disposition: A | Discharge status: Home / Self Care | Payer: No Typology Code available for payment source | Source: Ambulatory Visit | Attending: Obstetrics and Gynecology | Admitting: Obstetrics and Gynecology

## 2018-01-07 LAB — CBC
HCT: 34.5 % — ABNORMAL LOW (ref 36.0–46.0)
Hemoglobin: 11 g/dL — ABNORMAL LOW (ref 12.0–15.0)
MCH: 26.1 pg (ref 26.0–34.0)
MCHC: 31.9 g/dL (ref 30.0–36.0)
MCV: 81.9 fL (ref 78.0–100.0)
PLATELETS: 192 10*3/uL (ref 150–400)
RBC: 4.21 MIL/uL (ref 3.87–5.11)
RDW: 15.1 % (ref 11.5–15.5)
WBC: 6.4 10*3/uL (ref 4.0–10.5)

## 2018-01-07 LAB — TYPE AND SCREEN
ABO/RH(D): O POS
Antibody Screen: NEGATIVE

## 2018-01-07 NOTE — Patient Instructions (Signed)
[facility]  [facility]   Your procedure is scheduled on:  01/08/2018  [facility] at 1100 AM.  Pick up the phone at the desk and dial [phone]  Call this number if you have problems the morning of surgery:[phone]  Remember:   Do not eat food:(After Midnight) Desps de medianoche.  Do not drink clear liquids: (After Midnight) Desps de medianoche.  Take these medicines the morning of surgery with A SIP OF WATER: may take zantac   Do not wear jewelry, make-up or nail polish.  Do not wear lotions, powders, or perfumes. Do not wear deodorant.  Do not shave 48 hours prior to surgery.  Do not bring valuables to the hospital.  [facility] is not   responsible for any belongings or valuables brought to the hospital.  Contacts, dentures or bridgework may not be worn into surgery.  Leave suitcase in the car. After surgery it may be brought to your room.  For patients admitted to the hospital, checkout time is 11:00 AM the day of              discharge.    N/A   Please read over the following fact sheets that you were given:   Surgical Site Infection Prevention

## 2018-01-07 NOTE — Anesthesia Preprocedure Evaluation (Addendum)
Anesthesia Evaluation  Patient identified by MRN, date of birth, ID band Patient awake    Reviewed: Allergy & Precautions, NPO status , Patient's Chart, lab work & pertinent test results  Airway Mallampati: II  TM Distance: >3 FB Neck ROM: Full    Dental no notable dental hx. (+) Teeth Intact, Dental Advisory Given   Pulmonary neg pulmonary ROS,    Pulmonary exam normal breath sounds clear to auscultation       Cardiovascular Exercise Tolerance: Good negative cardio ROS Normal cardiovascular exam Rhythm:Regular Rate:Normal     Neuro/Psych negative neurological ROS  negative psych ROS   GI/Hepatic   Endo/Other    Renal/GU Renal disease     Musculoskeletal   Abdominal (+) + obese,   Peds  Hematology  (+) anemia ,   Anesthesia Other Findings   Reproductive/Obstetrics (+) Pregnancy                            Lab Results  Component Value Date   WBC 6.4 01/07/2018   HGB 11.0 (L) 01/07/2018   HCT 34.5 (L) 01/07/2018   MCV 81.9 01/07/2018   PLT 192 01/07/2018    Anesthesia Physical Anesthesia Plan  ASA: III  Anesthesia Plan: Spinal   Post-op Pain Management:    Induction:   PONV Risk Score and Plan: Treatment may vary due to age or medical condition  Airway Management Planned: Natural Airway and Nasal Cannula  Additional Equipment:   Intra-op Plan:   Post-operative Plan:   Informed Consent: I have reviewed the patients History and Physical, chart, labs and discussed the procedure including the risks, benefits and alternatives for the proposed anesthesia with the patient or authorized representative who has indicated his/her understanding and acceptance.     Plan Discussed with:   Anesthesia Plan Comments:         Anesthesia Quick Evaluation

## 2018-01-08 ENCOUNTER — Encounter (HOSPITAL_COMMUNITY): Payer: Self-pay

## 2018-01-08 ENCOUNTER — Encounter (HOSPITAL_COMMUNITY)
Admission: AD | Disposition: A | Discharge status: Home / Self Care | Payer: Self-pay | Source: Home / Self Care | Attending: Obstetrics and Gynecology

## 2018-01-08 ENCOUNTER — Inpatient Hospital Stay (HOSPITAL_COMMUNITY)
Admission: RE | Admit: 2018-01-08 | Payer: No Typology Code available for payment source | Source: Ambulatory Visit | Admitting: Obstetrics and Gynecology

## 2018-01-08 ENCOUNTER — Inpatient Hospital Stay (HOSPITAL_COMMUNITY)
Admission: AD | Admit: 2018-01-08 | Discharge: 2018-01-10 | DRG: 788 | Disposition: A | Discharge status: Home / Self Care | Payer: No Typology Code available for payment source | Attending: Obstetrics and Gynecology | Admitting: Obstetrics and Gynecology

## 2018-01-08 ENCOUNTER — Inpatient Hospital Stay (HOSPITAL_COMMUNITY): Payer: No Typology Code available for payment source | Admitting: Anesthesiology

## 2018-01-08 DIAGNOSIS — O9902 Anemia complicating childbirth: Secondary | ICD-10-CM | POA: Diagnosis present

## 2018-01-08 DIAGNOSIS — Z23 Encounter for immunization: Secondary | ICD-10-CM | POA: Diagnosis not present

## 2018-01-08 DIAGNOSIS — Z3A39 39 weeks gestation of pregnancy: Secondary | ICD-10-CM

## 2018-01-08 DIAGNOSIS — D649 Anemia, unspecified: Secondary | ICD-10-CM | POA: Diagnosis present

## 2018-01-08 DIAGNOSIS — O34211 Maternal care for low transverse scar from previous cesarean delivery: Principal | ICD-10-CM | POA: Diagnosis present

## 2018-01-08 LAB — RPR: RPR Ser Ql: NONREACTIVE

## 2018-01-08 SURGERY — Surgical Case
Anesthesia: Spinal

## 2018-01-08 MED ORDER — OXYTOCIN 10 UNIT/ML IJ SOLN
INTRAVENOUS | Status: DC | PRN
  Administered 2018-01-08: 40 [IU] via INTRAVENOUS

## 2018-01-08 MED ORDER — DIPHENHYDRAMINE HCL 25 MG PO CAPS: 25.0000 mg | ORAL_CAPSULE | Freq: Four times a day (QID) | ORAL | Status: DC | PRN

## 2018-01-08 MED ORDER — MEPERIDINE HCL 25 MG/ML IJ SOLN: 6.2500 mg | INTRAMUSCULAR | Status: DC | PRN

## 2018-01-08 MED ORDER — IBUPROFEN 600 MG PO TABS
600.0000 mg | ORAL_TABLET | Freq: Four times a day (QID) | ORAL | Status: DC
  Administered 2018-01-08 – 2018-01-10 (×8): 600 mg via ORAL
  Filled 2018-01-08 (×8): qty 1

## 2018-01-08 MED ORDER — ACETAMINOPHEN 10 MG/ML IV SOLN: 1000.0000 mg | Freq: Once | INTRAVENOUS | Status: DC | PRN

## 2018-01-08 MED ORDER — INFLUENZA VAC SPLIT QUAD 0.5 ML IM SUSY
0.5000 mL | PREFILLED_SYRINGE | INTRAMUSCULAR | Status: AC
  Administered 2018-01-10: 0.5 mL via INTRAMUSCULAR
  Filled 2018-01-08: qty 0.5

## 2018-01-08 MED ORDER — COCONUT OIL OIL: 1.0000 "application " | TOPICAL_OIL | Status: DC | PRN

## 2018-01-08 MED ORDER — SIMETHICONE 80 MG PO CHEW
80.0000 mg | CHEWABLE_TABLET | ORAL | Status: DC | PRN
  Administered 2018-01-08: 80 mg via ORAL

## 2018-01-08 MED ORDER — MEASLES, MUMPS & RUBELLA VAC ~~LOC~~ INJ
0.5000 mL | INJECTION | Freq: Once | SUBCUTANEOUS | Status: DC
  Filled 2018-01-08: qty 0.5

## 2018-01-08 MED ORDER — SCOPOLAMINE 1 MG/3DAYS TD PT72
MEDICATED_PATCH | TRANSDERMAL | Status: AC
  Filled 2018-01-08: qty 1

## 2018-01-08 MED ORDER — SCOPOLAMINE 1 MG/3DAYS TD PT72
1.0000 | MEDICATED_PATCH | Freq: Once | TRANSDERMAL | Status: DC
  Administered 2018-01-08: 1.5 mg via TRANSDERMAL

## 2018-01-08 MED ORDER — OXYCODONE HCL 5 MG PO TABS: 10.0000 mg | ORAL_TABLET | ORAL | Status: DC | PRN

## 2018-01-08 MED ORDER — LACTATED RINGERS IV SOLN
INTRAVENOUS | Status: DC
  Administered 2018-01-08: 13:00:00 via INTRAVENOUS

## 2018-01-08 MED ORDER — KETOROLAC TROMETHAMINE 30 MG/ML IJ SOLN
INTRAMUSCULAR | Status: AC
  Filled 2018-01-08: qty 1

## 2018-01-08 MED ORDER — SOD CITRATE-CITRIC ACID 500-334 MG/5ML PO SOLN
30.0000 mL | Freq: Once | ORAL | Status: AC
  Administered 2018-01-08: 30 mL via ORAL
  Filled 2018-01-08: qty 15

## 2018-01-08 MED ORDER — OXYTOCIN 40 UNITS IN LACTATED RINGERS INFUSION - SIMPLE MED: 2.5000 [IU]/h | INTRAVENOUS | Status: AC

## 2018-01-08 MED ORDER — ACETAMINOPHEN 325 MG PO TABS
650.0000 mg | ORAL_TABLET | ORAL | Status: DC | PRN
  Administered 2018-01-08 – 2018-01-10 (×4): 650 mg via ORAL
  Filled 2018-01-08 (×5): qty 2

## 2018-01-08 MED ORDER — ZOLPIDEM TARTRATE 5 MG PO TABS: 5.0000 mg | ORAL_TABLET | Freq: Every evening | ORAL | Status: DC | PRN

## 2018-01-08 MED ORDER — KETOROLAC TROMETHAMINE 30 MG/ML IJ SOLN
30.0000 mg | Freq: Four times a day (QID) | INTRAMUSCULAR | Status: AC | PRN
  Administered 2018-01-08: 30 mg via INTRAVENOUS

## 2018-01-08 MED ORDER — EPHEDRINE SULFATE 50 MG/ML IJ SOLN
INTRAMUSCULAR | Status: DC | PRN
  Administered 2018-01-08: 10 mg via INTRAVENOUS

## 2018-01-08 MED ORDER — TETANUS-DIPHTH-ACELL PERTUSSIS 5-2.5-18.5 LF-MCG/0.5 IM SUSP: 0.5000 mL | Freq: Once | INTRAMUSCULAR | Status: DC

## 2018-01-08 MED ORDER — SIMETHICONE 80 MG PO CHEW
80.0000 mg | CHEWABLE_TABLET | Freq: Three times a day (TID) | ORAL | Status: DC
  Administered 2018-01-09 – 2018-01-10 (×2): 80 mg via ORAL
  Filled 2018-01-08 (×4): qty 1

## 2018-01-08 MED ORDER — SODIUM CHLORIDE 0.9 % IV SOLN
INTRAVENOUS | Status: DC | PRN
  Administered 2018-01-08: 60 ug/min via INTRAVENOUS

## 2018-01-08 MED ORDER — SIMETHICONE 80 MG PO CHEW
80.0000 mg | CHEWABLE_TABLET | ORAL | Status: DC
  Administered 2018-01-08 – 2018-01-09 (×2): 80 mg via ORAL
  Filled 2018-01-08 (×2): qty 1

## 2018-01-08 MED ORDER — HYDROCODONE-ACETAMINOPHEN 7.5-325 MG PO TABS: 1.0000 | ORAL_TABLET | Freq: Once | ORAL | Status: DC | PRN

## 2018-01-08 MED ORDER — MORPHINE SULFATE-NACL 0.5-0.9 MG/ML-% IV SOSY
PREFILLED_SYRINGE | INTRAVENOUS | Status: DC | PRN
  Administered 2018-01-08: .15 mg via INTRATHECAL

## 2018-01-08 MED ORDER — LACTATED RINGERS IV SOLN
INTRAVENOUS | Status: DC | PRN
  Administered 2018-01-08: 08:00:00 via INTRAVENOUS

## 2018-01-08 MED ORDER — NALBUPHINE HCL 10 MG/ML IJ SOLN: 5.0000 mg | INTRAMUSCULAR | Status: DC | PRN

## 2018-01-08 MED ORDER — SENNOSIDES-DOCUSATE SODIUM 8.6-50 MG PO TABS
2.0000 | ORAL_TABLET | ORAL | Status: DC
  Administered 2018-01-08 – 2018-01-09 (×2): 2 via ORAL
  Filled 2018-01-08 (×2): qty 2

## 2018-01-08 MED ORDER — NALOXONE HCL 4 MG/10ML IJ SOLN: 1.0000 ug/kg/h | INTRAVENOUS | Status: DC | PRN

## 2018-01-08 MED ORDER — PROMETHAZINE HCL 25 MG/ML IJ SOLN
6.2500 mg | INTRAMUSCULAR | Status: DC | PRN
  Administered 2018-01-08: 6.25 mg via INTRAVENOUS

## 2018-01-08 MED ORDER — PRENATAL MULTIVITAMIN CH
1.0000 | ORAL_TABLET | Freq: Every day | ORAL | Status: DC
  Administered 2018-01-09 – 2018-01-10 (×2): 1 via ORAL
  Filled 2018-01-08 (×2): qty 1

## 2018-01-08 MED ORDER — DIPHENHYDRAMINE HCL 50 MG/ML IJ SOLN: 12.5000 mg | INTRAMUSCULAR | Status: DC | PRN

## 2018-01-08 MED ORDER — DEXAMETHASONE SODIUM PHOSPHATE 10 MG/ML IJ SOLN
INTRAMUSCULAR | Status: DC | PRN
  Administered 2018-01-08: 4 mg via INTRAVENOUS

## 2018-01-08 MED ORDER — KETOROLAC TROMETHAMINE 30 MG/ML IJ SOLN: 30.0000 mg | Freq: Four times a day (QID) | INTRAMUSCULAR | Status: AC | PRN

## 2018-01-08 MED ORDER — PHENYLEPHRINE 40 MCG/ML (10ML) SYRINGE FOR IV PUSH (FOR BLOOD PRESSURE SUPPORT)
PREFILLED_SYRINGE | INTRAVENOUS | Status: DC | PRN
  Administered 2018-01-08: 40 ug via INTRAVENOUS
  Administered 2018-01-08: 80 ug via INTRAVENOUS
  Administered 2018-01-08: 40 ug via INTRAVENOUS

## 2018-01-08 MED ORDER — NALBUPHINE HCL 10 MG/ML IJ SOLN: 5.0000 mg | Freq: Once | INTRAMUSCULAR | Status: DC | PRN

## 2018-01-08 MED ORDER — ONDANSETRON HCL 4 MG PO TABS: 4.0000 mg | ORAL_TABLET | Freq: Four times a day (QID) | ORAL | Status: DC | PRN

## 2018-01-08 MED ORDER — BUPIVACAINE IN DEXTROSE 0.75-8.25 % IT SOLN
INTRATHECAL | Status: DC | PRN
  Administered 2018-01-08: 12 mg via INTRATHECAL

## 2018-01-08 MED ORDER — CEFAZOLIN SODIUM-DEXTROSE 2-4 GM/100ML-% IV SOLN: 2.0000 g | INTRAVENOUS | Status: DC

## 2018-01-08 MED ORDER — HYDROMORPHONE HCL 1 MG/ML IJ SOLN
INTRAMUSCULAR | Status: AC
  Filled 2018-01-08: qty 1

## 2018-01-08 MED ORDER — CEFAZOLIN SODIUM-DEXTROSE 2-4 GM/100ML-% IV SOLN
2.0000 g | Freq: Once | INTRAVENOUS | Status: AC
  Administered 2018-01-08: 2 g via INTRAVENOUS
  Filled 2018-01-08: qty 100

## 2018-01-08 MED ORDER — WITCH HAZEL-GLYCERIN EX PADS: 1.0000 "application " | MEDICATED_PAD | CUTANEOUS | Status: DC | PRN

## 2018-01-08 MED ORDER — OXYCODONE HCL 5 MG PO TABS
5.0000 mg | ORAL_TABLET | ORAL | Status: DC | PRN
  Administered 2018-01-09 – 2018-01-10 (×4): 5 mg via ORAL
  Filled 2018-01-08 (×4): qty 1

## 2018-01-08 MED ORDER — HYDROMORPHONE HCL 1 MG/ML IJ SOLN
0.2500 mg | INTRAMUSCULAR | Status: DC | PRN
  Administered 2018-01-08: 0.5 mg via INTRAVENOUS
  Administered 2018-01-08: 0.25 mg via INTRAVENOUS

## 2018-01-08 MED ORDER — DIBUCAINE 1 % RE OINT: 1.0000 "application " | TOPICAL_OINTMENT | RECTAL | Status: DC | PRN

## 2018-01-08 MED ORDER — MENTHOL 3 MG MT LOZG: 1.0000 | LOZENGE | OROMUCOSAL | Status: DC | PRN

## 2018-01-08 MED ORDER — FAMOTIDINE IN NACL 20-0.9 MG/50ML-% IV SOLN
20.0000 mg | Freq: Once | INTRAVENOUS | Status: AC
  Administered 2018-01-08: 20 mg via INTRAVENOUS
  Filled 2018-01-08: qty 50

## 2018-01-08 MED ORDER — DIPHENHYDRAMINE HCL 25 MG PO CAPS: 25.0000 mg | ORAL_CAPSULE | ORAL | Status: DC | PRN

## 2018-01-08 MED ORDER — PROMETHAZINE HCL 25 MG/ML IJ SOLN
INTRAMUSCULAR | Status: AC
  Filled 2018-01-08: qty 1

## 2018-01-08 MED ORDER — FENTANYL CITRATE (PF) 100 MCG/2ML IJ SOLN
INTRAMUSCULAR | Status: DC | PRN
  Administered 2018-01-08: 15 ug via INTRATHECAL

## 2018-01-08 MED ORDER — ONDANSETRON HCL 4 MG/2ML IJ SOLN
INTRAMUSCULAR | Status: DC | PRN
  Administered 2018-01-08: 4 mg via INTRAVENOUS

## 2018-01-08 MED ORDER — LACTATED RINGERS IV SOLN
INTRAVENOUS | Status: DC | PRN
  Administered 2018-01-08 (×2): via INTRAVENOUS

## 2018-01-08 MED ORDER — PHENYLEPHRINE HCL 10 MG/ML IJ SOLN: INTRAMUSCULAR | Status: DC | PRN

## 2018-01-08 MED ORDER — PROMETHAZINE HCL 25 MG/ML IJ SOLN: 12.5000 mg | Freq: Four times a day (QID) | INTRAMUSCULAR | Status: DC | PRN

## 2018-01-08 SURGICAL SUPPLY — 31 items
CHLORAPREP W/TINT 26ML (MISCELLANEOUS) ×3 IMPLANT
CLAMP CORD UMBIL (MISCELLANEOUS) IMPLANT
CLOTH BEACON ORANGE TIMEOUT ST (SAFETY) ×3 IMPLANT
DERMABOND ADVANCED (GAUZE/BANDAGES/DRESSINGS) ×2
DERMABOND ADVANCED .7 DNX12 (GAUZE/BANDAGES/DRESSINGS) ×1 IMPLANT
DRSG OPSITE POSTOP 4X10 (GAUZE/BANDAGES/DRESSINGS) ×3 IMPLANT
ELECT REM PT RETURN 9FT ADLT (ELECTROSURGICAL) ×3
ELECTRODE REM PT RTRN 9FT ADLT (ELECTROSURGICAL) ×1 IMPLANT
EXTRACTOR VACUUM M CUP 4 TUBE (SUCTIONS) IMPLANT
EXTRACTOR VACUUM M CUP 4' TUBE (SUCTIONS)
GLOVE BIO SURGEON STRL SZ 6.5 (GLOVE) ×2 IMPLANT
GLOVE BIO SURGEONS STRL SZ 6.5 (GLOVE) ×1
GLOVE BIOGEL PI IND STRL 7.0 (GLOVE) ×2 IMPLANT
GLOVE BIOGEL PI INDICATOR 7.0 (GLOVE) ×4
GOWN STRL REUS W/TWL LRG LVL3 (GOWN DISPOSABLE) ×6 IMPLANT
KIT ABG SYR 3ML LUER SLIP (SYRINGE) IMPLANT
NEEDLE HYPO 25X5/8 SAFETYGLIDE (NEEDLE) IMPLANT
NS IRRIG 1000ML POUR BTL (IV SOLUTION) ×3 IMPLANT
PACK C SECTION WH (CUSTOM PROCEDURE TRAY) ×3 IMPLANT
PAD OB MATERNITY 4.3X12.25 (PERSONAL CARE ITEMS) ×3 IMPLANT
PENCIL SMOKE EVAC W/HOLSTER (ELECTROSURGICAL) ×3 IMPLANT
SUT CHROMIC 0 CT 802H (SUTURE) IMPLANT
SUT CHROMIC 0 CTX 36 (SUTURE) ×9 IMPLANT
SUT MON AB 4-0 PS1 27 (SUTURE) ×3 IMPLANT
SUT MON AB-0 CT1 36 (SUTURE) ×3 IMPLANT
SUT PDS AB 0 CTX 60 (SUTURE) ×3 IMPLANT
SUT PLAIN 0 NONE (SUTURE) IMPLANT
SUT VIC AB 4-0 KS 27 (SUTURE) IMPLANT
SYR BULB 3OZ (MISCELLANEOUS) ×3 IMPLANT
TOWEL OR 17X24 6PK STRL BLUE (TOWEL DISPOSABLE) ×3 IMPLANT
TRAY FOLEY W/BAG SLVR 14FR LF (SET/KITS/TRAYS/PACK) IMPLANT

## 2018-01-08 NOTE — MAU Note (Signed)
Pt states water broke at 0440-blood tinged fluid. Reports contractions every 10-315mins. Reports good fetal movement.

## 2018-01-08 NOTE — Op Note (Signed)
Cesarean Section Procedure Note  Pre-operative Diagnosis: IUP at term, Labor, Previous c/s for repeat  Post-operative Diagnosis: same  Surgeon: Turner DanielsLOWE,Selenne Coggin C   Assistants: none  Anesthesia: spinal  Procedure:  Low Segment Transverse cesarean section  Procedure Details  The patient was seen in the Holding Room. The risks, benefits, complications, treatment options, and expected outcomes were discussed with the patient.  The patient concurred with the proposed plan, giving informed consent.  The site of surgery properly noted/marked.. A Time Out was held and the above information confirmed.  After induction of anesthesia, the patient was draped and prepped in the usual sterile manner. A Pfannenstiel incision was made and carried down through the subcutaneous tissue to the fascia. Fascial incision was made and extended transversely. The fascia was separated from the underlying rectus tissue superiorly and inferiorly. The peritoneum was identified and entered. Peritoneal incision was extended longitudinally. The utero-vesical peritoneal reflection was incised transversely and the bladder flap was bluntly freed from the lower uterine segment. A low transverse uterine incision was made. Delivered from vertex presentation was a baby with Apgar scores of 9 at one minute and 9 at five minutes. After the umbilical cord was clamped and cut cord blood was obtained for evaluation. The placenta was removed intact and appeared normal. The uterine outline, tubes and ovaries appeared normal. The uterine incision was closed with running locked sutures of 0 monocryl and imbricated with 0 monocryl. Hemostasis was observed. Lavage was carried out until clear. The peritoneum was then closed with 0 monocryl and rectus muscles plicated in the midline.  After hemostasis was assured, the fascia was then reapproximated with running sutures of 0 PDS. Irrigation was applied and after adequate hemostasis was assured, the skin was  reapproximated with subcutaneous sutures using 4-0 monocryl.  Instrument, sponge, and needle counts were correct prior the abdominal closure and at the conclusion of the case. The patient received 2 grams cefotetan preoperatively.  Findings: Viable female "Fraser Dinreston"  Estimated Blood Loss:  600cc         Specimens: Placenta was sent to labor and delivery         Complications:  None

## 2018-01-08 NOTE — Anesthesia Procedure Notes (Signed)
Spinal  Patient location during procedure: OB Start time: 01/08/2018 7:44 AM End time: 01/08/2018 7:48 AM Staffing Anesthesiologist: Trevor IhaHouser, Stephen A, MD Performed: anesthesiologist  Preanesthetic Checklist Completed: patient identified, surgical consent, pre-op evaluation, timeout performed, IV checked, risks and benefits discussed and monitors and equipment checked Spinal Block Patient position: sitting Prep: site prepped and draped and DuraPrep Patient monitoring: heart rate, cardiac monitor, continuous pulse ox and blood pressure Approach: midline Location: L3-4 Injection technique: single-shot Needle Needle type: Pencan  Needle gauge: 24 G Needle length: 10 cm Assessment Sensory level: T4 Additional Notes 1 attempt  Patient tolerated procedure well.

## 2018-01-08 NOTE — Anesthesia Postprocedure Evaluation (Signed)
Anesthesia Post Note  Patient: Vel[facility]chetKawanta [facility]  Procedure(s) Performed: REPEAT CESAREAN SECTION (N/A )     Patient location during evaluation: Mother Baby Anesthesia Type: Spinal Level of consciousness: awake and alert and oriented Pain management: satisfactory to patient Vital Signs Assessment: post-procedure vital signs reviewed and stable Respiratory status: spontaneous breathing and nonlabored ventilation Cardiovascular status: stable Postop Assessment: no headache, no backache, patient able to bend at knees, no signs of nausea or vomiting and adequate PO intake Anesthetic complications: no    Last Vitals:  Vitals:   01/08/18 1300 01/08/18 1400  BP: (!) 109/51 107/66  Pulse: (!) 59   Resp: 18 18  Temp: 36.8 C 36.9 C  SpO2: 100% 99%    Last Pain:  Vitals:   01/08/18 1528  TempSrc:   PainSc: 6    Pain Goal:                 Darlyn Repsher

## 2018-01-08 NOTE — Lactation Note (Signed)
This note was copied from a baby's chart. Lactation Consultation Note  Patient Name: Elaine Morris Reason for consult: Initial assessment;Term;Other (Comment)(History of low milk supply)  Visited with P2 Mom of term baby delivered by C/Section.  Baby transferred to NICU due to low O2 sats, needing oxygen.  [facility]   Baby has remained STS on Mom's chest sleeping.    RN assisted with trying to latch baby, but he has been sleepy following his visit to the NICU.    Mom has large breasts and flat nipples.  When breast sandwiched, nipple everts some.  Hand pump given to use to pre-pump.  Breast massage and hand expression demonstrated, and encouraged Mom to do this as much as she can.  No colostrum expressed.  Mom has a history of low milk supply with first child who is 6.  Mom states that baby's weight dropped and she worked with OP lactation and used SNS at the breast.  She pumped and tried for a month.  Mom couldn't remember what milk volume she was able to express. 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   Mom reports breast changes with this pregnancy.  No risk factors for AMA.  Baby placed STS over her breast.  Baby became fussy when moving him.  No rooting noted.    Set up DEBP due to history of low milk supply, and assisted Mom with first pumping.  21 flanges better fit to move nipple.    Mom knows to call for assistance when baby starts showing feeding cues.  Baby to remain STS until baby's first feeding.  Mom understands the importance of this.  Mom to pump both breasts every 2-3 hrs on initiation setting to maximize early breast stimulation, to best stimulate a fuller milk supply with this baby.  Assembly and cleaning instruction reviewed.  Talked about supplementation with EBM when she is able to express by hand expression or with double pump.  Mom to ask for  assistance with this.  When baby begins rooting, to put any EBM under nipple shield to entice baby to latch.  Formula would be warranted at 24 hrs of age if baby is too sleepy to latch to breast, or if he becomes frantically hungry and unable to attain a deep latch to breast.  RN has brought nipple shield into room.  Explained to Mom baby should be given chance to latch to her breast first, before using a nipple shield.  Mom in agreement.  Lactation brochure left with Mom.  Mom aware of IP and OP lactation support.  Mom to call prn. RN aware of plan.  Interventions Interventions: Breast feeding basics reviewed;Assisted with latch;Skin to skin;Breast massage;Hand express;Pre-pump if needed;Breast compression;Adjust position;Support pillows;Position options;Hand pump;DEBP  Lactation Tools Discussed/Used Tools: Pump Breast pump type: Double-Electric Breast Pump;Manual WIC Program: No Pump Review: Setup, frequency, and cleaning;Milk Storage Initiated by:: Erby Pian Mirza Kidney RN IBCLC Date initiated:: 01/08/18   Consult Status Consult Status: Follow-up Date: 01/09/18 Follow-up type: In-patient    Elaine Morris, Elaine Morris, 3:07 PM

## 2018-01-08 NOTE — Transfer of Care (Signed)
Immediate Anesthesia Transfer of Care Note  Patient: Elaine Morris  Procedure(s) Performed: REPEAT CESAREAN SECTION (N/A )  Patient Location: PACU  Anesthesia Type:Spinal  Level of Consciousness: awake, alert , oriented and patient cooperative  Airway & Oxygen Therapy: Patient Spontanous Breathing  Post-op Assessment: Report given to RN and Post -op Vital signs reviewed and stable  Post vital signs: Reviewed and stable  Last Vitals:  Vitals Value Taken Time  BP    Temp    Pulse 57 01/08/2018  8:54 AM  Resp    SpO2 98 % 01/08/2018  8:54 AM  Vitals shown include unvalidated device data.  Last Pain:  Vitals:   01/08/18 0542  TempSrc: Oral         Complications: No apparent anesthesia complications

## 2018-01-08 NOTE — H&P (Signed)
Elaine Morris is a 37 y.o. female presenting for SROM and UCs. Scheduled for repeat C/S today. Pregnancy uncomplicated. OB History    Gravida  3   Para  1   Term  1   Preterm      AB  1   Living  1     SAB  1   TAB      Ectopic      Multiple      Live Births  1          Past Medical History:  Diagnosis Date  . Anemia   . Herniated disc L5  . Kidney stone   . No pertinent past medical history   . Seasonal allergies    Past Surgical History:  Procedure Laterality Date  . CESAREAN SECTION  03/01/2011   Procedure: CESAREAN SECTION;  Surgeon: Meriel Picaichard M Holland;  Location: [facility];  Service: Gynecology;  Laterality: N/A;  . partial right oopharectomy  2008  . RIGHT OOPHORECTOMY     partial    Family History: family history includes Asthma in her mother; Deep vein thrombosis in her mother; Hyperthyroidism in her mother; Melanoma in her maternal grandfather. Social History:  reports that she has never smoked. She has never used smokeless tobacco. She reports that she does not drink alcohol or use drugs.     Maternal Diabetes: No Genetic Screening: Normal Maternal Ultrasounds/Referrals: Normal Fetal Ultrasounds or other Referrals:  None Maternal Substance Abuse:  No Significant Maternal Medications:  None Significant Maternal Lab Results:  None Other Comments:  None  Review of Systems  Constitutional: Negative for fever.  Eyes: Negative for blurred vision.  Gastrointestinal: Negative for abdominal pain.  Neurological: Negative for headaches.   Maternal Medical History:  Reason for admission: Rupture of membranes and contractions.   Contractions: Onset was 1-2 hours ago.    Fetal activity: Perceived fetal activity is normal.      Dilation: (unable to reach) Effacement (%): Thick Exam by:: Dr. Henderson Cloudomblin Blood pressure (!) 141/72, pulse 85, temperature 97.8 F (36.6 C), temperature source Oral, resp. rate 18, height 5\' 3"  (1.6 m), weight 95.7 kg, last  menstrual period 04/10/2017, SpO2 99 %. Maternal Exam:  Abdomen: Fetal presentation: vertex     Fetal Exam Fetal State Assessment: Category I - tracings are normal.     Physical Exam  Cardiovascular: Normal rate and regular rhythm.  Respiratory: Effort normal and breath sounds normal.  GI: Soft. There is no tenderness.  Neurological: She has normal reflexes.    Cx exam compromised by patient discomfort FHT cat one UCs q5-10 min  Prenatal labs: ABO, Rh: --/--/O POS (09/16 0940) Antibody: NEG (09/16 0940) Rubella: Immune (02/14 0000) RPR: Non Reactive (09/16 0940)  HBsAg: Negative (02/14 0000)  HIV: Non-reactive (02/14 0000)  GBS:     Assessment/Plan: 37 yo G3P1  SROM and contracting D/W patient repeat C/S and risks including infection, organ damage, bleeding/transfusion-HIV/Hep, DVT/PE, pneumonia, wound breakdown and return to OR. Patient states she understands and agrees   Roselle LocusJames E Fayette Gasner II 01/08/2018, 5:46 AM

## 2018-01-09 ENCOUNTER — Other Ambulatory Visit: Payer: Self-pay

## 2018-01-09 LAB — BIRTH TISSUE RECOVERY COLLECTION (PLACENTA DONATION)

## 2018-01-09 LAB — CBC
HEMATOCRIT: 26.8 % — AB (ref 36.0–46.0)
HEMOGLOBIN: 8.9 g/dL — AB (ref 12.0–15.0)
MCH: 27.2 pg (ref 26.0–34.0)
MCHC: 33.2 g/dL (ref 30.0–36.0)
MCV: 82 fL (ref 78.0–100.0)
Platelets: 147 10*3/uL — ABNORMAL LOW (ref 150–400)
RBC: 3.27 MIL/uL — AB (ref 3.87–5.11)
RDW: 14.9 % (ref 11.5–15.5)
WBC: 9 10*3/uL (ref 4.0–10.5)

## 2018-01-09 NOTE — Progress Notes (Signed)
Subjective: Postpartum Day 1: Cesarean Delivery Patient reports incisional pain, tolerating PO and no problems voiding.    Objective: Vital signs in last 24 hours: Temp:  [97.5 F (36.4 C)-99.2 F (37.3 C)] 98.2 F (36.8 C) (09/18 0500) Pulse Rate:  [51-70] 55 (09/18 0500) Resp:  [12-18] 16 (09/18 0500) BP: (102-121)/(36-72) 105/54 (09/18 0500) SpO2:  [97 %-100 %] 99 % (09/18 0500)  Physical Exam:  General: alert and cooperative Lochia: appropriate Uterine Fundus: firm Incision: healing well, no significant drainage DVT Evaluation: No evidence of DVT seen on physical exam.  Recent Labs    01/07/18 0940 01/09/18 0623  HGB 11.0* 8.9*  HCT 34.5* 26.8*    Assessment/Plan: Status post Cesarean section. Doing well postoperatively.  Continue current care. Pt desires circ - r/b/a discussed, questions answered, informed consent  Zelphia CairoGretchen Makih Stefanko 01/09/2018, 8:27 AM

## 2018-01-10 ENCOUNTER — Encounter (HOSPITAL_COMMUNITY): Payer: Self-pay

## 2018-01-10 MED ORDER — OXYCODONE HCL 5 MG PO TABS: 5.0000 mg | ORAL_TABLET | ORAL | 0 refills | Status: AC | PRN

## 2018-01-10 MED ORDER — IBUPROFEN 600 MG PO TABS: 600.0000 mg | ORAL_TABLET | Freq: Four times a day (QID) | ORAL | 0 refills | Status: AC

## 2018-01-10 NOTE — Discharge Instructions (Signed)
Call MD for T>100.4, heavy vaginal bleeding, severe abdominal pain, intractable nausea and/or vomiting, or respiratory distress.  Call office to schedule postop incision check in 1-2 weeks.  Pelvic rest x 6 weeks.  No driving while taking narcotics.   °

## 2018-01-10 NOTE — Discharge Summary (Signed)
Obstetric Discharge Summary Reason for Admission: onset of labor and cesarean section Prenatal Procedures: none Intrapartum Procedures: cesarean: low cervical, transverse repeat Postpartum Procedures: none Complications-Operative and Postpartum: none Hemoglobin  Date Value Ref Range Status  01/09/2018 8.9 (L) 12.0 - 15.0 g/dL Final   HCT  Date Value Ref Range Status  01/09/2018 26.8 (L) 36.0 - 46.0 % Final    Physical Exam:  General: alert, cooperative and appears stated age 27Lochia: appropriate Uterine Fundus: firm Incision: healing well, no significant drainage, no dehiscence DVT Evaluation: No evidence of DVT seen on physical exam. Negative Homan's sign. No cords or calf tenderness.  Discharge Diagnoses: Term Pregnancy-delivered  Discharge Information: Date: 01/10/2018 Activity: pelvic rest Diet: routine Medications: PNV, Ibuprofen, Iron and Percocet Condition: stable Instructions: refer to practice specific booklet Discharge to: home   Newborn Data: Live born female  Birth Weight: 8 lb 11.3 oz (3950 g) APGAR: 8, 9  Newborn Delivery   Birth date/time:  01/08/2018 08:14:00 Delivery type:  C-Section, Low Transverse Trial of labor:  No C-section categorization:  Repeat     Home with mother.  Sahan Pen 01/10/2018, 9:21 AM

## 2018-01-10 NOTE — Progress Notes (Signed)
Subjective: Postpartum Day 2: Cesarean Delivery Patient reports tolerating PO, + flatus and no problems voiding.  Requests d/c home today.  Objective: Vital signs in last 24 hours: Temp:  [97.9 F (36.6 C)-98.5 F (36.9 C)] 98.5 F (36.9 C) (09/19 0539) Pulse Rate:  [52-87] 56 (09/19 0539) Resp:  [17-20] 18 (09/19 0539) BP: (99-114)/(55-71) 114/71 (09/19 0539) SpO2:  [100 %] 100 % (09/18 1409)  Physical Exam:  General: alert, cooperative and appears stated age Lochia: appropriate Uterine Fundus: firm Incision: healing well, no significant drainage, no dehiscence DVT Evaluation: No evidence of DVT seen on physical exam. Negative Homan's sign. No cords or calf tenderness.  Recent Labs    01/07/18 0940 01/09/18 0623  HGB 11.0* 8.9*  HCT 34.5* 26.8*    Assessment/Plan: Status post Cesarean section. Doing well postoperatively.  Discharge home with standard precautions and return to clinic in 4-6 weeks.  Imane Burrough 01/10/2018, 9:17 AM

## 2018-01-10 NOTE — Lactation Note (Signed)
This note was copied from a baby's chart. Lactation Consultation Note Baby 40 hrs old. Mom holding baby STS. Stated he fed well.  Mom has flat nipples. Hand expression w/no colostrum noted. Mom stated she us using DEBP w/colostrum on flange. Encouraged to cont. To pump for stimulation. Mom using SNS w/formula, mom taping to bare nipple. Mom states baby is able to obtain a deep latch she thinks at times, at times he doesn't. Mom has NS but states is "is trying to hold off for now in using it".  Mom stated baby has from 5-15 min. Feedings. Asked mom after 5 min. Feeding how long did the baby go until was cueing for feeding again. Mom stated 2 hrs. Encouraged mom to try stimulate baby to have a good 20 min,. At least feeding. Discussed ways of stimulation. Mom wasn't interested. LC asked mom if she has any questions, mom stated no she was ok.  LC is concerned that baby will not be able to transfer milk w/o NS. Encouraged pumping and hand expression afterwards.  Patient Name: Elaine Morris [id number]'XToday's Date: 01/10/2018 Reason for consult: Follow-up assessment   Maternal Data    Feeding Feeding Type: Breast Fed  LATCH Score       Type of Nipple: Flat  Comfort (Breast/Nipple): Soft / non-tender        Interventions Interventions: Skin to skin;Breast massage;Hand express;Breast compression;Shells;DEBP  Lactation Tools Discussed/Used Tools: Supplemental Nutrition System   Consult Status Consult Status: Follow-up Date: 01/10/18 Follow-up type: In-patient    Elaine Morris, Diamond NickelLAURA G 01/10/2018, 1:00 AM

## 2018-03-13 IMAGING — MR MR LUMBAR SPINE W/O CM
5 series · 48 of 48 positions shown · non-contrast
Comparison: 02/03/2010

CLINICAL DATA: Right sciatica.  Disc herniation.

EXAM:
MRI LUMBAR SPINE WITHOUT CONTRAST
TECHNIQUE: Multiplanar, multisequence MR imaging of the lumbar spine was
performed. No intravenous contrast was administered.

[Series 3: T2 · sagittal · 4.0mm · 0.79mm/px · 5 of 12 slices shown (1 of 2)]
[im 1/12]
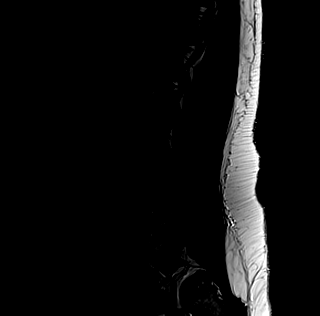
[im 3/12]
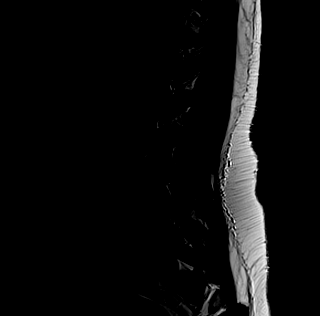
[im 6/12]
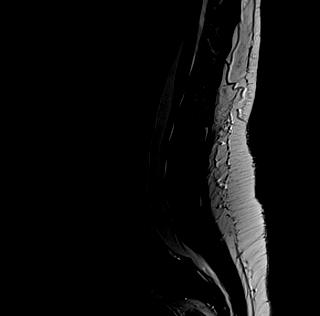
[im 9/12]
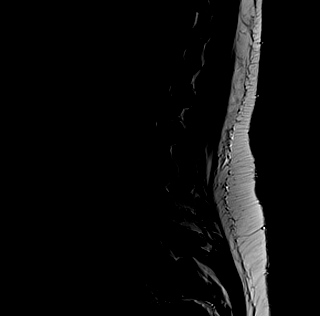
[im 12/12]
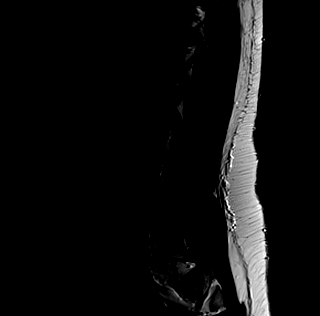

[Series 4: tirm sag · sagittal · 4.0mm · 0.79mm/px · 5 of 12 slices shown]
[im 1/12]
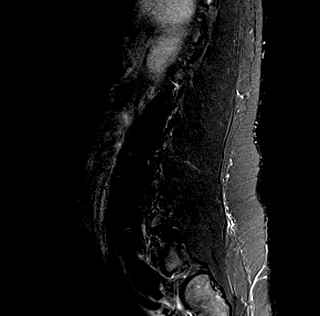
[im 3/12]
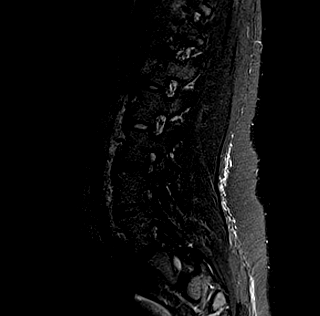
[im 6/12]
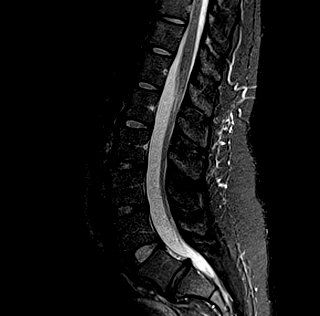
[im 9/12]
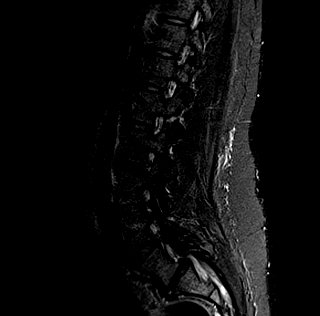
[im 12/12]
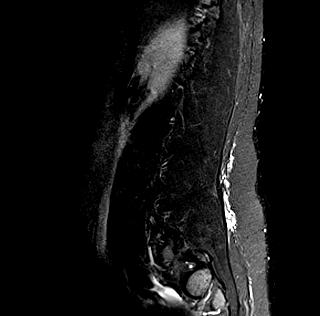

[Series 5: T1 · sagittal · 4.0mm · 0.79mm/px · 6 of 12 slices shown (1 of 2)]
[im 1/12]
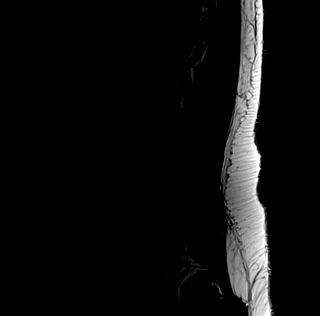
[im 3/12]
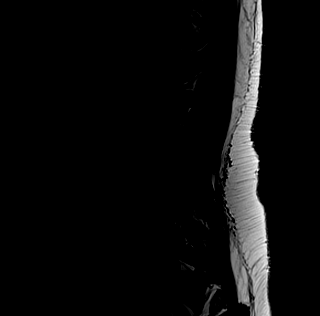
[im 5/12]
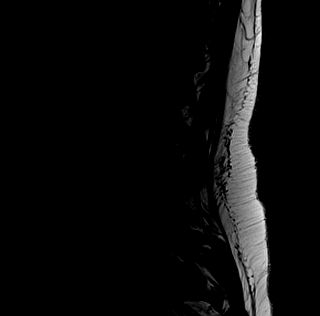
[im 7/12]
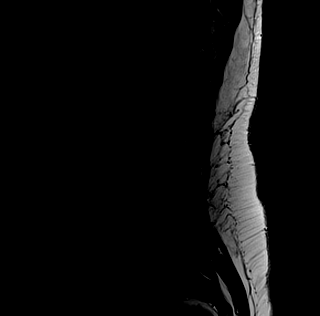
[im 9/12]
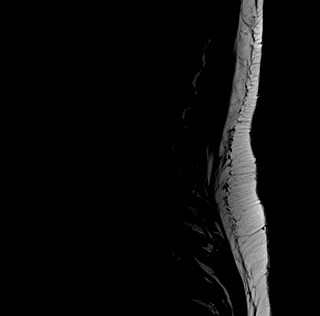
[im 12/12]
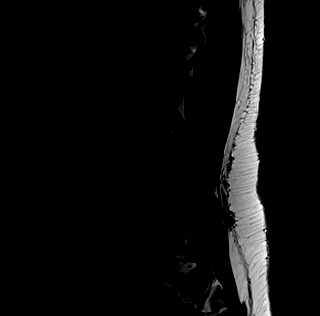

[Series 9: T1 · axial · 4.0mm · 0.70mm/px · z∈[-129,+54]mm · 16 of 34 slices shown (2 of 2)]
[im 1/34]
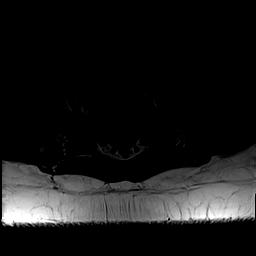
[im 3/34]
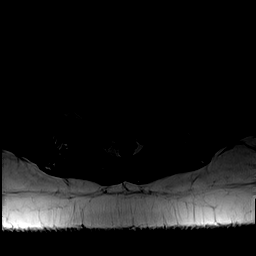
[im 5/34]
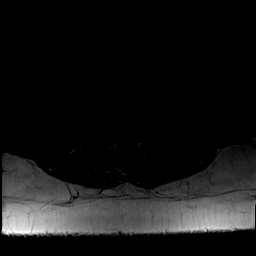
[im 7/34]
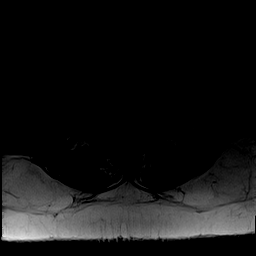
[im 9/34]
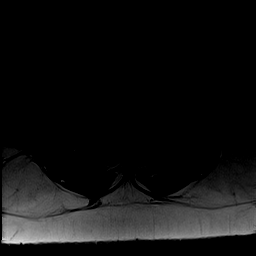
[im 12/34]
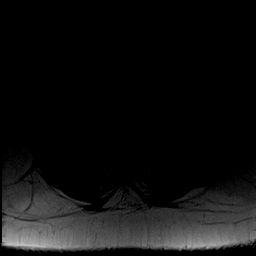
[im 14/34]
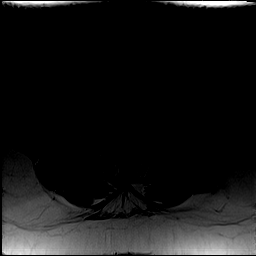
[im 16/34]
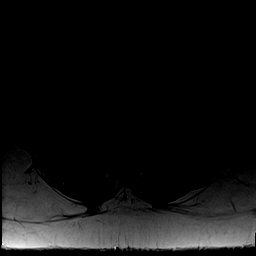
[im 18/34]
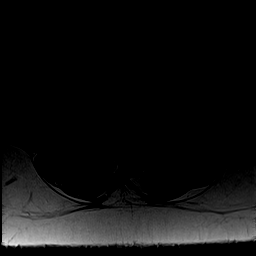
[im 20/34]
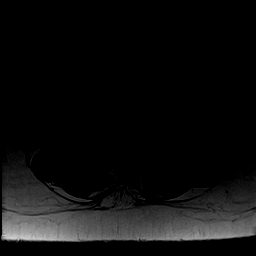
[im 23/34]
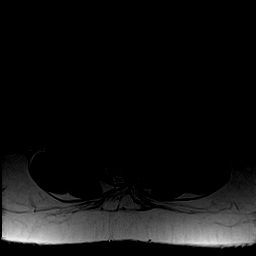
[im 25/34]
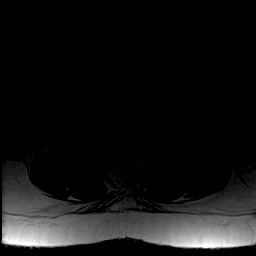
[im 27/34]
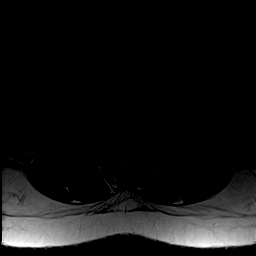
[im 29/34]
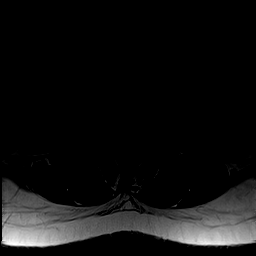
[im 31/34]
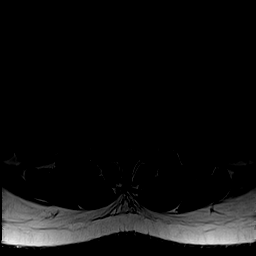
[im 34/34]
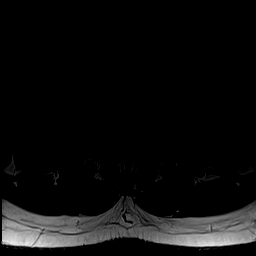

[Series 12: T2 · axial · 4.0mm · 0.70mm/px · z∈[-129,+54]mm · 16 of 34 slices shown (2 of 2)]
[im 1/34]
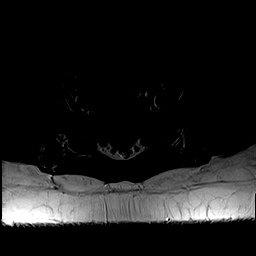
[im 3/34]
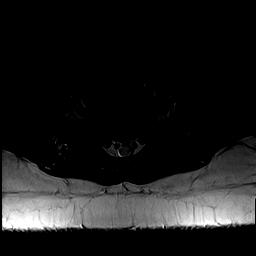
[im 5/34]
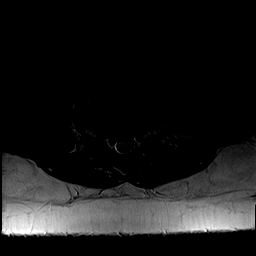
[im 7/34]
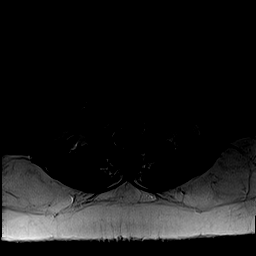
[im 9/34]
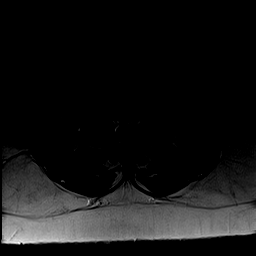
[im 12/34]
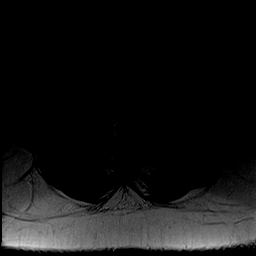
[im 14/34]
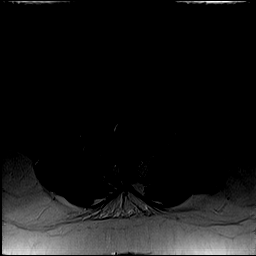
[im 16/34]
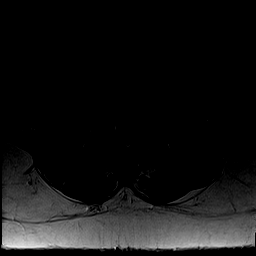
[im 18/34]
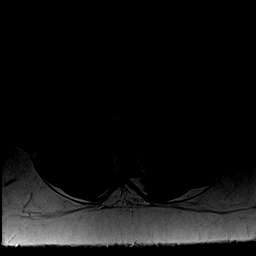
[im 20/34]
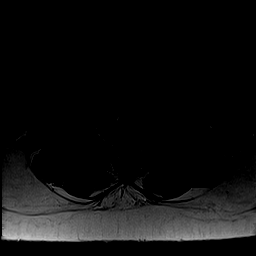
[im 23/34]
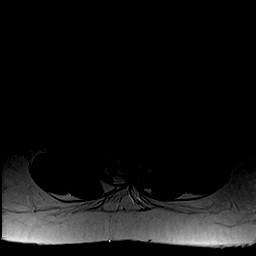
[im 25/34]
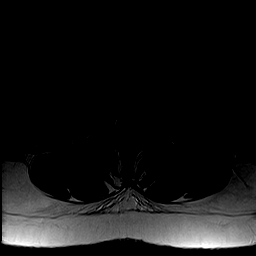
[im 27/34]
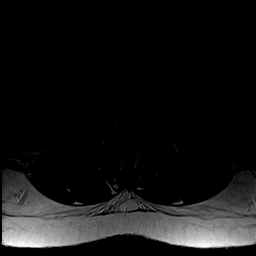
[im 29/34]
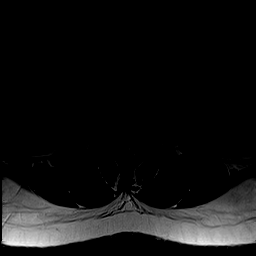
[im 31/34]
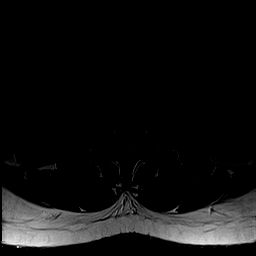
[im 34/34]
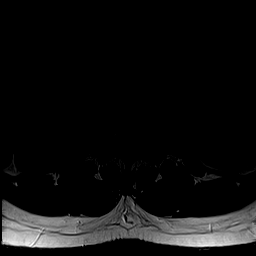

[48 of 48 positions shown; findings below may reference images not displayed]

FINDINGS: Segmentation: Spinal numbering as on the previous study. There are
12 paired ribs on 8118 chest x-ray. Ribs are not clearly visible on
this study.

Alignment:  Exaggerated lumbar lordosis.

Vertebrae:  No fracture, evidence of discitis, or bone lesion.

Conus medullaris: Extends to the T12 level and appears normal.

Paraspinal and other soft tissues: Negative

Disc levels:

T12-L1 to L4-5: Mild loss of height and hydration of discs. No
herniation or impingement. Early facet spurring at L4-5.

L5-S1:Chronic moderate disc narrowing and bulging with right
paracentral protrusion at an annular fissure causing mass effect on
the right S1 nerve root. Mass effect appears mildly improved
compared to 3944. There is a new small central disc protrusion that
is noncompressive.
IMPRESSION: 1. Focal moderate disc degeneration at L5-S1. Chronic right
paracentral disc protrusion with S1 mass-effect, stable to slightly
improved compared to 3944. New small and noncompressive central disc
protrusion at this level.
2. Early facet spurring at L4-5.

## 2018-06-17 ENCOUNTER — Other Ambulatory Visit: Payer: Self-pay | Admitting: Family Medicine

## 2018-06-17 MED ORDER — AMOXICILLIN 875 MG PO TABS: 875.0000 mg | ORAL_TABLET | Freq: Two times a day (BID) | ORAL | 0 refills | Status: AC

## 2019-01-28 ENCOUNTER — Other Ambulatory Visit: Payer: Self-pay | Admitting: Occupational Medicine

## 2019-01-29 ENCOUNTER — Other Ambulatory Visit: Payer: Self-pay | Admitting: *Deleted

## 2019-01-29 DIAGNOSIS — M25562 Pain in left knee: Secondary | ICD-10-CM

## 2019-01-29 LAB — CBC WITH DIFFERENTIAL/PLATELET
Absolute Monocytes: 582 cells/uL (ref 200–950)
Basophils Absolute: 49 cells/uL (ref 0–200)
Basophils Relative: 0.6 %
Eosinophils Absolute: 74 cells/uL (ref 15–500)
Eosinophils Relative: 0.9 %
HCT: 40.6 % (ref 35.0–45.0)
Hemoglobin: 13 g/dL (ref 11.7–15.5)
Lymphs Abs: 1952 cells/uL (ref 850–3900)
MCH: 26.6 pg — ABNORMAL LOW (ref 27.0–33.0)
MCHC: 32 g/dL (ref 32.0–36.0)
MCV: 83.2 fL (ref 80.0–100.0)
MPV: 9.5 fL (ref 7.5–12.5)
Monocytes Relative: 7.1 %
Neutro Abs: 5543 cells/uL (ref 1500–7800)
Neutrophils Relative %: 67.6 %
Platelets: 357 10*3/uL (ref 140–400)
RBC: 4.88 10*6/uL (ref 3.80–5.10)
RDW: 12.5 % (ref 11.0–15.0)
Total Lymphocyte: 23.8 %
WBC: 8.2 10*3/uL (ref 3.8–10.8)

## 2019-01-29 LAB — COMPLETE METABOLIC PANEL WITH GFR
AG Ratio: 1.4 (calc) (ref 1.0–2.5)
ALT: 8 U/L (ref 6–29)
AST: 14 U/L (ref 10–30)
Albumin: 4.3 g/dL (ref 3.6–5.1)
Alkaline phosphatase (APISO): 58 U/L (ref 31–125)
BUN: 10 mg/dL (ref 7–25)
CO2: 25 mmol/L (ref 20–32)
Calcium: 9.5 mg/dL (ref 8.6–10.2)
Chloride: 103 mmol/L (ref 98–110)
Creat: 0.82 mg/dL (ref 0.50–1.10)
GFR, Est African American: 106 mL/min/{1.73_m2} (ref >=60)
GFR, Est Non African American: 91 mL/min/{1.73_m2} (ref >=60)
Globulin: 3.1 g/dL (calc) (ref 1.9–3.7)
Glucose, Bld: 94 mg/dL (ref 65–99)
Potassium: 4.5 mmol/L (ref 3.5–5.3)
Sodium: 136 mmol/L (ref 135–146)
Total Bilirubin: 0.6 mg/dL (ref 0.2–1.2)
Total Protein: 7.4 g/dL (ref 6.1–8.1)

## 2019-01-29 LAB — LIPID PANEL
Cholesterol: 221 mg/dL — ABNORMAL HIGH (ref <200)
HDL: 51 mg/dL (ref >=50)
LDL Cholesterol (Calc): 153 mg/dL (calc) — ABNORMAL HIGH
Non-HDL Cholesterol (Calc): 170 mg/dL (calc) — ABNORMAL HIGH (ref <130)
Total CHOL/HDL Ratio: 4.3 (calc) (ref <5.0)
Triglycerides: 73 mg/dL (ref <150)

## 2019-01-29 LAB — TSH: TSH: 0.96 mIU/L

## 2019-01-29 NOTE — Progress Notes (Signed)
Patient reports x2 weeks of L knee pain. States that she struck knee on baby gate at home and pain has not improved.   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 Fellow MD agreed to imaging.   No medications prescribed.

## 2019-01-30 ENCOUNTER — Ambulatory Visit
Admission: RE | Admit: 2019-01-30 | Discharge: 2019-01-30 | Disposition: A | Discharge status: Home / Self Care | Payer: No Typology Code available for payment source | Source: Ambulatory Visit | Attending: Family Medicine | Admitting: Family Medicine

## 2019-01-30 DIAGNOSIS — M25562 Pain in left knee: Secondary | ICD-10-CM

## 2020-06-04 ENCOUNTER — Other Ambulatory Visit: Payer: Self-pay

## 2020-06-04 DIAGNOSIS — Z0184 Encounter for antibody response examination: Secondary | ICD-10-CM

## 2020-06-04 DIAGNOSIS — Z111 Encounter for screening for respiratory tuberculosis: Secondary | ICD-10-CM

## 2020-06-06 LAB — QUANTIFERON-TB GOLD PLUS
Mitogen-NIL: >10 IU/mL
NIL: 0.03 IU/mL
QuantiFERON-TB Gold Plus: NEGATIVE
TB1-NIL: 0 IU/mL
TB2-NIL: 0 IU/mL

## 2020-06-07 LAB — HEPATITIS B SURFACE ANTIBODY, QUANTITATIVE: Hepatitis B-Post: 153 m[IU]/mL (ref >=10)

## 2021-09-08 ENCOUNTER — Other Ambulatory Visit: Payer: Self-pay | Admitting: Family Medicine

## 2021-09-08 DIAGNOSIS — Z1231 Encounter for screening mammogram for malignant neoplasm of breast: Secondary | ICD-10-CM

## 2021-09-30 ENCOUNTER — Ambulatory Visit: Payer: No Typology Code available for payment source

## 2021-10-21 ENCOUNTER — Encounter: Payer: Self-pay | Admitting: Radiology

## 2021-10-21 ENCOUNTER — Ambulatory Visit
Admission: RE | Admit: 2021-10-21 | Discharge: 2021-10-21 | Disposition: A | Discharge status: Home / Self Care | Payer: No Typology Code available for payment source | Source: Ambulatory Visit | Attending: Family Medicine | Admitting: Family Medicine

## 2021-10-21 DIAGNOSIS — Z1231 Encounter for screening mammogram for malignant neoplasm of breast: Secondary | ICD-10-CM

## 2022-10-31 ENCOUNTER — Other Ambulatory Visit: Payer: Self-pay | Admitting: Family Medicine

## 2022-10-31 DIAGNOSIS — Z1231 Encounter for screening mammogram for malignant neoplasm of breast: Secondary | ICD-10-CM

## 2022-11-03 ENCOUNTER — Ambulatory Visit
Admission: RE | Admit: 2022-11-03 | Discharge: 2022-11-03 | Disposition: A | Discharge status: Home / Self Care | Payer: No Typology Code available for payment source | Source: Ambulatory Visit | Attending: Family Medicine | Admitting: Family Medicine

## 2022-11-03 DIAGNOSIS — Z1231 Encounter for screening mammogram for malignant neoplasm of breast: Secondary | ICD-10-CM

## 2023-10-22 ENCOUNTER — Other Ambulatory Visit: Payer: Self-pay | Admitting: Family Medicine

## 2023-10-22 DIAGNOSIS — Z1231 Encounter for screening mammogram for malignant neoplasm of breast: Secondary | ICD-10-CM

## 2023-11-09 ENCOUNTER — Ambulatory Visit
Admission: RE | Admit: 2023-11-09 | Discharge: 2023-11-09 | Disposition: A | Discharge status: Home / Self Care | Payer: Self-pay | Source: Ambulatory Visit

## 2023-11-09 DIAGNOSIS — Z1231 Encounter for screening mammogram for malignant neoplasm of breast: Secondary | ICD-10-CM
